# Patient Record
Sex: Female | Born: 1960 | Race: Black or African American | Hispanic: No | Smoking: Former smoker
Health system: Southern US, Community
[De-identification: ages and names within clinical notes are randomized; demographics above are authoritative.]

## PROBLEM LIST (undated history)

## (undated) DIAGNOSIS — M1990 Unspecified osteoarthritis, unspecified site: Secondary | ICD-10-CM

---

## 2015-05-30 DIAGNOSIS — M62838 Other muscle spasm: Secondary | ICD-10-CM | POA: Diagnosis not present

## 2015-05-30 DIAGNOSIS — L508 Other urticaria: Secondary | ICD-10-CM | POA: Diagnosis not present

## 2015-05-30 DIAGNOSIS — R21 Rash and other nonspecific skin eruption: Secondary | ICD-10-CM | POA: Diagnosis not present

## 2015-06-19 DIAGNOSIS — Z1231 Encounter for screening mammogram for malignant neoplasm of breast: Secondary | ICD-10-CM | POA: Diagnosis not present

## 2015-07-30 DIAGNOSIS — M545 Low back pain: Secondary | ICD-10-CM | POA: Diagnosis not present

## 2015-09-12 DIAGNOSIS — G8929 Other chronic pain: Secondary | ICD-10-CM | POA: Diagnosis not present

## 2015-09-12 DIAGNOSIS — Z79899 Other long term (current) drug therapy: Secondary | ICD-10-CM | POA: Diagnosis not present

## 2015-09-12 DIAGNOSIS — T50905A Adverse effect of unspecified drugs, medicaments and biological substances, initial encounter: Secondary | ICD-10-CM | POA: Diagnosis not present

## 2016-03-19 DIAGNOSIS — M545 Low back pain: Secondary | ICD-10-CM | POA: Diagnosis not present

## 2016-03-19 DIAGNOSIS — M542 Cervicalgia: Secondary | ICD-10-CM | POA: Diagnosis not present

## 2016-06-17 DIAGNOSIS — Z1231 Encounter for screening mammogram for malignant neoplasm of breast: Secondary | ICD-10-CM | POA: Diagnosis not present

## 2016-06-23 DIAGNOSIS — R51 Headache: Secondary | ICD-10-CM | POA: Diagnosis not present

## 2016-06-23 DIAGNOSIS — M62838 Other muscle spasm: Secondary | ICD-10-CM | POA: Diagnosis not present

## 2016-06-23 DIAGNOSIS — Z79899 Other long term (current) drug therapy: Secondary | ICD-10-CM | POA: Diagnosis not present

## 2016-06-23 DIAGNOSIS — I10 Essential (primary) hypertension: Secondary | ICD-10-CM | POA: Diagnosis not present

## 2016-12-23 DIAGNOSIS — G8929 Other chronic pain: Secondary | ICD-10-CM | POA: Diagnosis not present

## 2016-12-23 DIAGNOSIS — I10 Essential (primary) hypertension: Secondary | ICD-10-CM | POA: Diagnosis not present

## 2016-12-23 DIAGNOSIS — Z79899 Other long term (current) drug therapy: Secondary | ICD-10-CM | POA: Diagnosis not present

## 2016-12-23 DIAGNOSIS — M545 Low back pain: Secondary | ICD-10-CM | POA: Diagnosis not present

## 2017-04-06 DIAGNOSIS — J328 Other chronic sinusitis: Secondary | ICD-10-CM | POA: Diagnosis not present

## 2017-04-06 DIAGNOSIS — F17200 Nicotine dependence, unspecified, uncomplicated: Secondary | ICD-10-CM | POA: Diagnosis not present

## 2017-04-06 DIAGNOSIS — M545 Low back pain: Secondary | ICD-10-CM | POA: Diagnosis not present

## 2017-04-06 DIAGNOSIS — I10 Essential (primary) hypertension: Secondary | ICD-10-CM | POA: Diagnosis not present

## 2017-04-06 DIAGNOSIS — Z79899 Other long term (current) drug therapy: Secondary | ICD-10-CM | POA: Diagnosis not present

## 2017-04-06 DIAGNOSIS — G8929 Other chronic pain: Secondary | ICD-10-CM | POA: Diagnosis not present

## 2017-07-17 DIAGNOSIS — Z1231 Encounter for screening mammogram for malignant neoplasm of breast: Secondary | ICD-10-CM | POA: Diagnosis not present

## 2017-09-11 DIAGNOSIS — M797 Fibromyalgia: Secondary | ICD-10-CM | POA: Diagnosis not present

## 2017-09-11 DIAGNOSIS — M150 Primary generalized (osteo)arthritis: Secondary | ICD-10-CM | POA: Diagnosis not present

## 2017-10-30 DIAGNOSIS — E785 Hyperlipidemia, unspecified: Secondary | ICD-10-CM | POA: Diagnosis not present

## 2017-10-30 DIAGNOSIS — M130 Polyarthritis, unspecified: Secondary | ICD-10-CM | POA: Diagnosis not present

## 2017-11-10 DIAGNOSIS — M5412 Radiculopathy, cervical region: Secondary | ICD-10-CM | POA: Diagnosis not present

## 2017-11-17 DIAGNOSIS — M542 Cervicalgia: Secondary | ICD-10-CM | POA: Diagnosis not present

## 2017-11-19 DIAGNOSIS — M542 Cervicalgia: Secondary | ICD-10-CM | POA: Diagnosis not present

## 2017-12-03 DIAGNOSIS — E782 Mixed hyperlipidemia: Secondary | ICD-10-CM | POA: Diagnosis not present

## 2017-12-03 DIAGNOSIS — I83899 Varicose veins of unspecified lower extremities with other complications: Secondary | ICD-10-CM | POA: Diagnosis not present

## 2017-12-03 DIAGNOSIS — Z6823 Body mass index (BMI) 23.0-23.9, adult: Secondary | ICD-10-CM | POA: Diagnosis not present

## 2017-12-03 DIAGNOSIS — M130 Polyarthritis, unspecified: Secondary | ICD-10-CM | POA: Diagnosis not present

## 2017-12-07 ENCOUNTER — Other Ambulatory Visit: Payer: Self-pay | Admitting: Neurological Surgery

## 2017-12-07 DIAGNOSIS — M542 Cervicalgia: Secondary | ICD-10-CM | POA: Diagnosis not present

## 2017-12-07 DIAGNOSIS — Z6823 Body mass index (BMI) 23.0-23.9, adult: Secondary | ICD-10-CM | POA: Diagnosis not present

## 2017-12-30 NOTE — Pre-Procedure Instructions (Signed)
Alyssa Jimenez  12/30/2017      [id number] [place], [place] - [address] [facility] [address]  [place] [id number] Phone: [phone] Fax: [phone]    Your procedure is scheduled on   Friday 01/08/18  [facility] at 600 A.M.  Call this number if you have problems the morning of surgery:  [phone]   Remember:  Do not eat or drink after midnight.    Take these medicines the morning of surgery with A SIP OF WATER - DIAZEPAM (VALIUM), TRAMADOL (ULTRAM) IF NEEDED  7 days prior to surgery STOP taking any Aspirin(unless otherwise instructed by your surgeon), Aleve, Naproxen, Ibuprofen, Motrin, Advil, Goody's, BC's, all herbal medications, fish oil, and all vitamins     Do not wear jewelry, make-up or nail polish.  Do not wear lotions, powders, or perfumes, or deodorant.  Do not shave 48 hours prior to surgery.  Men may shave face and neck.  Do not bring valuables to the hospital.  [facility] is not responsible for any belongings or valuables.  Contacts, dentures or bridgework may not be worn into surgery.  Leave your suitcase in the car.  After surgery it may be brought to your room.  For patients admitted to the hospital, discharge time will be determined by your treatment team.  Patients discharged the day of surgery will not be allowed to drive home.   Name and phone number of your driver:    Special instructions:  [place] - Preparing for Surgery  Before surgery, you can play an important role.  Because skin is not sterile, your skin needs to be as free of germs as possible.  You can reduce the number of germs on you skin by washing with CHG (chlorahexidine gluconate) soap before surgery.  CHG is an antiseptic cleaner which kills germs and bonds with the skin to continue killing germs even after washing.  Oral Hygiene is also important in reducing the risk of  infection.  Remember to brush your teeth with your regular toothpaste the morning of surgery.  Please DO NOT use if you have an allergy to CHG or antibacterial soaps.  If your skin becomes reddened/irritated stop using the CHG and inform your nurse when you arrive at Short Stay.  Do not shave (including legs and underarms) for at least 48 hours prior to the first CHG shower.  You may shave your face.  Please follow these instructions carefully:   1.  Shower with CHG Soap the night before surgery and the morning of Surgery.  2.  If you choose to wash your hair, wash your hair first as usual with your normal shampoo.  3.  After you shampoo, rinse your hair and body thoroughly to remove the shampoo. 4.  Use CHG as you would any other liquid soap.  You can apply chg directly to the skin and wash gently with a      scrungie or washcloth.           5.  Apply the CHG Soap to your body ONLY FROM THE NECK DOWN.   Do not use on open wounds or open sores. Avoid contact with your eyes, ears, mouth and genitals (private parts).  Wash genitals (private parts) with your normal soap.  6.  Wash thoroughly, paying special attention to the area where your surgery will be performed.  7.  Thoroughly  rinse your body with warm water from the neck down.  8.  DO NOT shower/wash with your normal soap after using and rinsing off the CHG Soap.  9.  Pat yourself dry with a clean towel.            10.  Wear clean pajamas.            11.  Place clean sheets on your bed the night of your first shower and do not sleep with pets.  Day of Surgery  Do not apply any lotions/deoderants the morning of surgery.   Please wear clean clothes to the hospital/surgery center. Remember to brush your teeth with toothpaste.     Please read over the following fact sheets that you were given. Pain Booklet, MRSA Information and Surgical Site Infection Prevention

## 2017-12-31 ENCOUNTER — Other Ambulatory Visit: Payer: Self-pay

## 2017-12-31 ENCOUNTER — Encounter (HOSPITAL_COMMUNITY)
Admission: RE | Admit: 2017-12-31 | Discharge: 2017-12-31 | Disposition: A | Discharge status: Home / Self Care | Payer: Medicare Other | Source: Ambulatory Visit | Attending: Neurological Surgery | Admitting: Neurological Surgery

## 2017-12-31 ENCOUNTER — Encounter (HOSPITAL_COMMUNITY): Payer: Self-pay | Admitting: *Deleted

## 2017-12-31 ENCOUNTER — Ambulatory Visit (HOSPITAL_COMMUNITY)
Admission: RE | Admit: 2017-12-31 | Discharge: 2017-12-31 | Disposition: A | Discharge status: Home / Self Care | Payer: Medicare Other | Source: Ambulatory Visit | Attending: Neurological Surgery | Admitting: Neurological Surgery

## 2017-12-31 DIAGNOSIS — M542 Cervicalgia: Secondary | ICD-10-CM | POA: Diagnosis not present

## 2017-12-31 DIAGNOSIS — R0789 Other chest pain: Secondary | ICD-10-CM | POA: Diagnosis not present

## 2017-12-31 DIAGNOSIS — Z01818 Encounter for other preprocedural examination: Secondary | ICD-10-CM | POA: Insufficient documentation

## 2017-12-31 HISTORY — DX: Unspecified osteoarthritis, unspecified site: M19.90

## 2017-12-31 LAB — BASIC METABOLIC PANEL
Anion gap: 8 (ref 5–15)
BUN: 12 mg/dL (ref 6–20)
CO2: 23 mmol/L (ref 22–32)
Calcium: 10 mg/dL (ref 8.9–10.3)
Chloride: 110 mmol/L (ref 98–111)
Creatinine, Ser: 0.9 mg/dL (ref 0.44–1.00)
GFR calc Af Amer: >60 mL/min (ref >60)
GFR calc non Af Amer: >60 mL/min (ref >60)
Glucose, Bld: 73 mg/dL (ref 70–99)
Potassium: 4.9 mmol/L (ref 3.5–5.1)
Sodium: 141 mmol/L (ref 135–145)

## 2017-12-31 LAB — SURGICAL PCR SCREEN
MRSA, PCR: NEGATIVE
STAPHYLOCOCCUS AUREUS: NEGATIVE

## 2017-12-31 NOTE — Pre-Procedure Instructions (Signed)
Alyssa Jimenez  12/31/2017      Your procedure is scheduled on   Friday 01/08/18  Report to [facility] Admitting at 6:00 A.M.  Call this number if you have problems the morning of surgery:  [phone]   Remember:  Do not eat or drink after midnight.    Take these medicines the morning of surgery with A SIP OF WATER - DIAZEPAM (VALIUM), TRAMADOL (ULTRAM) IF NEEDED  7 days prior to surgery STOP taking any Aspirin(unless otherwise instructed by your surgeon), Aleve, Naproxen, Ibuprofen, Motrin, Advil, Goody's, BC's, all herbal medications, fish oil, and all vitamins     Do not wear jewelry, make-up or nail polish.  Do not wear lotions, powders, or perfumes, or deodorant.  Do not shave 48 hours prior to surgery.  Men may shave face and neck.  Do not bring valuables to the hospital.  [facility] is not responsible for any belongings or valuables.  Contacts, dentures or bridgework may not be worn into surgery.  Leave your suitcase in the car.  After surgery it may be brought to your room.  For patients admitted to the hospital, discharge time will be determined by your treatment team.  Patients discharged the day of surgery will not be allowed to drive home.   Name and phone number of your driver:    Special instructions:  [place] - Preparing for Surgery  Before surgery, you can play an important role.  Because skin is not sterile, your skin needs to be as free of germs as possible.  You can reduce the number of germs on you skin by washing with CHG (chlorahexidine gluconate) soap before surgery.  CHG is an antiseptic cleaner which kills germs and bonds with the skin to continue killing germs even after washing.  Oral Hygiene is also important in reducing the risk of infection.  Remember to brush your teeth with your regular toothpaste the morning of surgery.  Please DO NOT use if you have an allergy to CHG or antibacterial soaps.  If your skin becomes  reddened/irritated stop using the CHG and inform your nurse when you arrive at Short Stay.  Do not shave (including legs and underarms) for at least 48 hours prior to the first CHG shower.  You may shave your face.  Please follow these instructions carefully:   1.  Shower with CHG Soap the night before surgery and the morning of Surgery.  2.  If you choose to wash your hair, wash your hair first as usual with your normal shampoo.  3.  After you shampoo, rinse your hair and body thoroughly to remove the shampoo.  Wash your face and private area with the soap you use at home, then rinse.  4.  Use CHG as you would any other liquid soap.  You can apply chg directly to the skin and wash gently with a scrungie or washcloth.                        5.  Apply the CHG Soap to your body ONLY FROM THE NECK DOWN.   Do not use on open wounds or open sores. Avoid contact with your eyes, ears, mouth and genitals (private parts).     6.  Wash thoroughly, paying special attention to the area where your surgery will be performed.   7.  Thoroughly rinse your body with warm water from the neck down.   8.  DO NOT shower/wash with your normal soap after using and rinsing off the CHG Soap.  9.  Pat yourself dry with a clean towel.             10.  Wear clean pajamas.             11.  Place clean sheets on your bed the night of your first shower and do not sleep with pets.  Day of Surgery  Do not apply any lotions/deoderants the morning of surgery.   Please wear clean clothes to the hospital/surgery center. Remember to brush your teeth with toothpaste.  Do not wear jewelry, make-up or nail polish.  Do not wear lotions, powders, or perfumes, or deodorant.  Do not shave 48 hours prior to surgery.  Men may shave face and neck.  Do not bring valuables to the hospital.  [facility] is not responsible for any belongings or valuables.  Contacts, dentures or bridgework may not be worn into surgery.  Leave your  suitcase in the car.  After surgery it may be brought to your room.  For patients admitted to the hospital, discharge time will be determined by your treatment team.  Please read over the following fact sheets that you were given. Pain Booklet, MRSA Information and Surgical Site Infection Prevention

## 2017-12-31 NOTE — Progress Notes (Addendum)
I asked Ms Alyssa Jimenez if she is experiencing pain, patient places her hand over the left side of her chest that travels to her left arm. Patient reports it as aching pain that has been coming and going since she was injured in 2011. I asked patient if she was checked out to see if it was cardiac related and she said yes at [facility] [place],  snt there by employer.[employer].  "They said it is from my neck." I sent a fax requesting orders. Ms Alyssa Jimenez denies shortness of breath, she is disabled due to pain, but she of ten cares for 3 grandchildren ages 89 x2 and 89.  I shared the above information with Antionette PolesJames Burns, PA. PCP is Dr Lowella BandyV Bland.

## 2017-12-31 NOTE — Progress Notes (Signed)
CBC with Diff and PT/INR clotted- I notified Erie NoeVanessa, she calaled back and said it is ok to draw the DOS.

## 2018-01-01 ENCOUNTER — Encounter (HOSPITAL_COMMUNITY): Payer: Self-pay

## 2018-01-01 NOTE — Progress Notes (Signed)
Anesthesia Chart Review:  Case:  [id number] Date/Time:  01/08/18 0800   Procedure:  ACDF - C4-C5 - C5-C6 - C6-C7 (N/A )   Anesthesia type:  General   Pre-op diagnosis:  Cervicalgia   Location:  [facility] OR ROOM 18 / [facility] OR   Surgeon:  Tia AlertJones, David S, MD      DISCUSSION: 57 yo female former smoker for above procedure.   Pt reports she has had pain radiating from her left neck through left chest/shoulder and down left arm since injury in 2011. Says she had cardiac eval at [facility] in [place] at the time of her injury that was negative and she was told pain was from her neck. EKG performed 01/01/18 with NSR. No history of CAD. [facility].  Pt's pain in left neck/shoulder/chest/arm likely radicular related to her neck injury. Anticipate she can proceed with surgery as planned barring acute status change.   VS: BP 133/78   Pulse 73   Temp 36.8 C   Resp 20   Ht 5\' 2"  (1.575 m)   Wt 59.5 kg   SpO2 98%   BMI 23.98 kg/m   PROVIDERS: Renaye RakersBland, Veita, MD is PCP   LABS: Labs reviewed: Acceptable for surgery.  CBC with Diff and PT/INR clotted- Dr. Yetta BarreJones scheduler Erie NoeVanessa notified, she said it is ok to draw DOS, orders placed by PAT nurse.  Labs Reviewed - No data to display all labs WNL   IMAGES: CHEST - 2 VIEW 12/31/2017  COMPARISON:  None.  FINDINGS: The heart size and mediastinal contours are within normal limits. Both lungs are clear. The visualized skeletal structures are unremarkable.  IMPRESSION: No active cardiopulmonary disease.   EKG: 12/31/2017: NSR  CV: N/A  Past Medical History:  Diagnosis Date  . Arthritis    Spine    Past Surgical History:  Procedure Laterality Date  . CESAREAN SECTION      MEDICATIONS: . cetirizine (ZYRTEC) 10 MG tablet  . cyclobenzaprine (FLEXERIL) 10 MG tablet  . diazepam (VALIUM) 5 MG tablet  . megestrol (MEGACE) 40 MG tablet  . Multiple Vitamins-Minerals (ONE DAILY MULTIVITAMIN WOMEN) TABS  .  naproxen (NAPROSYN) 500 MG tablet  . traMADol (ULTRAM) 50 MG tablet   No current facility-administered medications for this encounter.     Zannie CoveJames Burns, PA-C [facility]/Anesthesiology Phone [phone] 01/01/2018 12:06 PM

## 2018-01-07 ENCOUNTER — Encounter (HOSPITAL_COMMUNITY): Payer: Self-pay | Admitting: Certified Registered Nurse Anesthetist

## 2018-01-08 ENCOUNTER — Inpatient Hospital Stay (HOSPITAL_COMMUNITY): Payer: Medicare HMO

## 2018-01-08 ENCOUNTER — Encounter (HOSPITAL_COMMUNITY): Payer: Self-pay | Admitting: *Deleted

## 2018-01-08 ENCOUNTER — Ambulatory Visit (HOSPITAL_COMMUNITY)
Admission: RE | Disposition: A | Discharge status: Home / Self Care | Payer: Self-pay | Source: Ambulatory Visit | Attending: Neurological Surgery

## 2018-01-08 ENCOUNTER — Inpatient Hospital Stay (HOSPITAL_COMMUNITY): Payer: Medicare HMO | Admitting: Physician Assistant

## 2018-01-08 ENCOUNTER — Other Ambulatory Visit: Payer: Self-pay

## 2018-01-08 ENCOUNTER — Inpatient Hospital Stay (HOSPITAL_COMMUNITY): Payer: Medicare HMO | Admitting: Certified Registered Nurse Anesthetist

## 2018-01-08 ENCOUNTER — Observation Stay (HOSPITAL_COMMUNITY)
Admission: RE | Admit: 2018-01-08 | Discharge: 2018-01-09 | Disposition: A | Discharge status: Home / Self Care | Payer: Medicare HMO | Source: Ambulatory Visit | Attending: Neurological Surgery | Admitting: Neurological Surgery

## 2018-01-08 DIAGNOSIS — M4322 Fusion of spine, cervical region: Secondary | ICD-10-CM | POA: Diagnosis present

## 2018-01-08 DIAGNOSIS — Z87891 Personal history of nicotine dependence: Secondary | ICD-10-CM | POA: Insufficient documentation

## 2018-01-08 DIAGNOSIS — M4802 Spinal stenosis, cervical region: Secondary | ICD-10-CM | POA: Insufficient documentation

## 2018-01-08 DIAGNOSIS — Z981 Arthrodesis status: Secondary | ICD-10-CM | POA: Diagnosis not present

## 2018-01-08 DIAGNOSIS — Z880 Allergy status to penicillin: Secondary | ICD-10-CM | POA: Insufficient documentation

## 2018-01-08 DIAGNOSIS — M47812 Spondylosis without myelopathy or radiculopathy, cervical region: Principal | ICD-10-CM | POA: Insufficient documentation

## 2018-01-08 DIAGNOSIS — M542 Cervicalgia: Secondary | ICD-10-CM

## 2018-01-08 DIAGNOSIS — M50321 Other cervical disc degeneration at C4-C5 level: Secondary | ICD-10-CM | POA: Diagnosis not present

## 2018-01-08 DIAGNOSIS — S13150A Subluxation of C4/C5 cervical vertebrae, initial encounter: Secondary | ICD-10-CM | POA: Diagnosis not present

## 2018-01-08 DIAGNOSIS — Z419 Encounter for procedure for purposes other than remedying health state, unspecified: Secondary | ICD-10-CM

## 2018-01-08 HISTORY — PX: ANTERIOR CERVICAL DECOMP/DISCECTOMY FUSION: SHX1161

## 2018-01-08 LAB — CBC WITH DIFFERENTIAL/PLATELET
Abs Immature Granulocytes: 0.1 10*3/uL (ref 0.0–0.1)
BASOS ABS: 0.1 10*3/uL (ref 0.0–0.1)
BASOS PCT: 1 %
Eosinophils Absolute: 0.2 10*3/uL (ref 0.0–0.7)
Eosinophils Relative: 2 %
HCT: 46.8 % — ABNORMAL HIGH (ref 36.0–46.0)
Hemoglobin: 14.7 g/dL (ref 12.0–15.0)
IMMATURE GRANULOCYTES: 1 %
Lymphocytes Relative: 33 %
Lymphs Abs: 4 10*3/uL (ref 0.7–4.0)
MCH: 28.4 pg (ref 26.0–34.0)
MCHC: 31.4 g/dL (ref 30.0–36.0)
MCV: 90.3 fL (ref 78.0–100.0)
MONOS PCT: 7 %
Monocytes Absolute: 0.9 10*3/uL (ref 0.1–1.0)
NEUTROS ABS: 7.1 10*3/uL (ref 1.7–7.7)
NEUTROS PCT: 56 %
Platelets: 376 10*3/uL (ref 150–400)
RBC: 5.18 MIL/uL — ABNORMAL HIGH (ref 3.87–5.11)
RDW: 14 % (ref 11.5–15.5)
WBC: 12.4 10*3/uL — ABNORMAL HIGH (ref 4.0–10.5)

## 2018-01-08 SURGERY — ANTERIOR CERVICAL DECOMPRESSION/DISCECTOMY FUSION 3 LEVELS
Anesthesia: General

## 2018-01-08 MED ORDER — SUGAMMADEX SODIUM 500 MG/5ML IV SOLN
INTRAVENOUS | Status: AC
  Filled 2018-01-08: qty 5

## 2018-01-08 MED ORDER — LIDOCAINE 2% (20 MG/ML) 5 ML SYRINGE
INTRAMUSCULAR | Status: AC
  Filled 2018-01-08: qty 5

## 2018-01-08 MED ORDER — SODIUM CHLORIDE 0.9% FLUSH
3.0000 mL | Freq: Two times a day (BID) | INTRAVENOUS | Status: DC
  Administered 2018-01-08 (×2): 3 mL via INTRAVENOUS

## 2018-01-08 MED ORDER — ALUM & MAG HYDROXIDE-SIMETH 200-200-20 MG/5ML PO SUSP
30.0000 mL | Freq: Four times a day (QID) | ORAL | Status: DC | PRN
  Administered 2018-01-08: 30 mL via ORAL
  Filled 2018-01-08: qty 30

## 2018-01-08 MED ORDER — DEXAMETHASONE SODIUM PHOSPHATE 10 MG/ML IJ SOLN
INTRAMUSCULAR | Status: AC
  Filled 2018-01-08: qty 1

## 2018-01-08 MED ORDER — ROCURONIUM BROMIDE 50 MG/5ML IV SOSY
PREFILLED_SYRINGE | INTRAVENOUS | Status: AC
  Filled 2018-01-08: qty 5

## 2018-01-08 MED ORDER — ONDANSETRON HCL 4 MG/2ML IJ SOLN: 4.0000 mg | Freq: Four times a day (QID) | INTRAMUSCULAR | Status: DC | PRN

## 2018-01-08 MED ORDER — ROCURONIUM BROMIDE 50 MG/5ML IV SOSY
PREFILLED_SYRINGE | INTRAVENOUS | Status: DC | PRN
  Administered 2018-01-08: 50 mg via INTRAVENOUS
  Administered 2018-01-08: 20 mg via INTRAVENOUS

## 2018-01-08 MED ORDER — MEPERIDINE HCL 50 MG/ML IJ SOLN: 6.2500 mg | INTRAMUSCULAR | Status: DC | PRN

## 2018-01-08 MED ORDER — THROMBIN 5000 UNITS EX SOLR
CUTANEOUS | Status: AC
  Filled 2018-01-08: qty 5000

## 2018-01-08 MED ORDER — METHOCARBAMOL 500 MG PO TABS
500.0000 mg | ORAL_TABLET | Freq: Four times a day (QID) | ORAL | Status: DC | PRN
  Administered 2018-01-08 – 2018-01-09 (×4): 500 mg via ORAL
  Filled 2018-01-08 (×3): qty 1

## 2018-01-08 MED ORDER — LACTATED RINGERS IV SOLN: INTRAVENOUS | Status: DC

## 2018-01-08 MED ORDER — SODIUM CHLORIDE 0.9 % IV SOLN: 250.0000 mL | INTRAVENOUS | Status: DC

## 2018-01-08 MED ORDER — VANCOMYCIN HCL IN DEXTROSE 1-5 GM/200ML-% IV SOLN
INTRAVENOUS | Status: AC
  Filled 2018-01-08: qty 200

## 2018-01-08 MED ORDER — MEGESTROL ACETATE 40 MG PO TABS
40.0000 mg | ORAL_TABLET | Freq: Every day | ORAL | Status: DC
  Administered 2018-01-08: 40 mg via ORAL
  Filled 2018-01-08 (×3): qty 1

## 2018-01-08 MED ORDER — THROMBIN 5000 UNITS EX SOLR
OROMUCOSAL | Status: DC | PRN
  Administered 2018-01-08: 08:00:00 via TOPICAL

## 2018-01-08 MED ORDER — METHOCARBAMOL 500 MG PO TABS
ORAL_TABLET | ORAL | Status: AC
  Filled 2018-01-08: qty 1

## 2018-01-08 MED ORDER — BUPIVACAINE HCL (PF) 0.25 % IJ SOLN
INTRAMUSCULAR | Status: DC | PRN
  Administered 2018-01-08: 4 mL

## 2018-01-08 MED ORDER — SODIUM CHLORIDE 0.9 % IV SOLN
INTRAVENOUS | Status: DC | PRN
  Administered 2018-01-08: 08:00:00

## 2018-01-08 MED ORDER — FENTANYL CITRATE (PF) 100 MCG/2ML IJ SOLN
INTRAMUSCULAR | Status: AC
  Filled 2018-01-08: qty 2

## 2018-01-08 MED ORDER — PROPOFOL 10 MG/ML IV BOLUS
INTRAVENOUS | Status: AC
  Filled 2018-01-08: qty 20

## 2018-01-08 MED ORDER — SODIUM CHLORIDE 0.9 % IV SOLN
INTRAVENOUS | Status: DC | PRN
  Administered 2018-01-08: 20 ug/min via INTRAVENOUS

## 2018-01-08 MED ORDER — LACTATED RINGERS IV SOLN
INTRAVENOUS | Status: DC | PRN
  Administered 2018-01-08: 07:00:00 via INTRAVENOUS

## 2018-01-08 MED ORDER — MENTHOL 3 MG MT LOZG
1.0000 | LOZENGE | OROMUCOSAL | Status: DC | PRN
  Filled 2018-01-08: qty 9

## 2018-01-08 MED ORDER — VANCOMYCIN HCL 1000 MG IV SOLR
INTRAVENOUS | Status: DC | PRN
  Administered 2018-01-08: 1000 mg via INTRAVENOUS

## 2018-01-08 MED ORDER — HYDROCODONE-ACETAMINOPHEN 7.5-325 MG PO TABS
1.0000 | ORAL_TABLET | ORAL | Status: DC | PRN
  Administered 2018-01-08: 1 via ORAL

## 2018-01-08 MED ORDER — FENTANYL CITRATE (PF) 250 MCG/5ML IJ SOLN
INTRAMUSCULAR | Status: AC
  Filled 2018-01-08: qty 5

## 2018-01-08 MED ORDER — DEXAMETHASONE SODIUM PHOSPHATE 10 MG/ML IJ SOLN
10.0000 mg | INTRAMUSCULAR | Status: AC
  Administered 2018-01-08: 10 mg via INTRAVENOUS

## 2018-01-08 MED ORDER — PHENYLEPHRINE 40 MCG/ML (10ML) SYRINGE FOR IV PUSH (FOR BLOOD PRESSURE SUPPORT)
PREFILLED_SYRINGE | INTRAVENOUS | Status: AC
  Filled 2018-01-08: qty 10

## 2018-01-08 MED ORDER — THROMBIN 20000 UNITS EX KIT
PACK | CUTANEOUS | Status: AC
  Filled 2018-01-08: qty 1

## 2018-01-08 MED ORDER — PHENYLEPHRINE 40 MCG/ML (10ML) SYRINGE FOR IV PUSH (FOR BLOOD PRESSURE SUPPORT)
PREFILLED_SYRINGE | INTRAVENOUS | Status: DC | PRN
  Administered 2018-01-08: 80 ug via INTRAVENOUS

## 2018-01-08 MED ORDER — FENTANYL CITRATE (PF) 100 MCG/2ML IJ SOLN
25.0000 ug | INTRAMUSCULAR | Status: DC | PRN
  Administered 2018-01-08 (×3): 50 ug via INTRAVENOUS

## 2018-01-08 MED ORDER — ONDANSETRON HCL 4 MG PO TABS: 4.0000 mg | ORAL_TABLET | Freq: Four times a day (QID) | ORAL | Status: DC | PRN

## 2018-01-08 MED ORDER — BUPIVACAINE HCL (PF) 0.25 % IJ SOLN
INTRAMUSCULAR | Status: AC
  Filled 2018-01-08: qty 30

## 2018-01-08 MED ORDER — SENNA 8.6 MG PO TABS
1.0000 | ORAL_TABLET | Freq: Two times a day (BID) | ORAL | Status: DC
  Administered 2018-01-08: 8.6 mg via ORAL
  Filled 2018-01-08: qty 1

## 2018-01-08 MED ORDER — METOCLOPRAMIDE HCL 5 MG/ML IJ SOLN: 10.0000 mg | Freq: Once | INTRAMUSCULAR | Status: DC | PRN

## 2018-01-08 MED ORDER — ACETAMINOPHEN 325 MG PO TABS
650.0000 mg | ORAL_TABLET | ORAL | Status: DC | PRN
  Administered 2018-01-08: 650 mg via ORAL
  Filled 2018-01-08: qty 2

## 2018-01-08 MED ORDER — LIDOCAINE 2% (20 MG/ML) 5 ML SYRINGE
INTRAMUSCULAR | Status: DC | PRN
  Administered 2018-01-08: 100 mg via INTRAVENOUS

## 2018-01-08 MED ORDER — 0.9 % SODIUM CHLORIDE (POUR BTL) OPTIME
TOPICAL | Status: DC | PRN
  Administered 2018-01-08: 1000 mL

## 2018-01-08 MED ORDER — HYDROCODONE-ACETAMINOPHEN 7.5-325 MG PO TABS
ORAL_TABLET | ORAL | Status: AC
  Filled 2018-01-08: qty 1

## 2018-01-08 MED ORDER — SUGAMMADEX SODIUM 200 MG/2ML IV SOLN
INTRAVENOUS | Status: DC | PRN
  Administered 2018-01-08: 125 mg via INTRAVENOUS

## 2018-01-08 MED ORDER — CHLORHEXIDINE GLUCONATE CLOTH 2 % EX PADS: 6.0000 | MEDICATED_PAD | Freq: Once | CUTANEOUS | Status: DC

## 2018-01-08 MED ORDER — OXYCODONE HCL 5 MG PO TABS
5.0000 mg | ORAL_TABLET | ORAL | Status: DC | PRN
  Administered 2018-01-08 – 2018-01-09 (×5): 5 mg via ORAL
  Filled 2018-01-08 (×5): qty 1

## 2018-01-08 MED ORDER — THROMBIN 20000 UNITS EX SOLR
CUTANEOUS | Status: DC | PRN
  Administered 2018-01-08: 08:00:00 via TOPICAL

## 2018-01-08 MED ORDER — METHOCARBAMOL 1000 MG/10ML IJ SOLN
500.0000 mg | Freq: Four times a day (QID) | INTRAVENOUS | Status: DC | PRN
  Filled 2018-01-08: qty 5

## 2018-01-08 MED ORDER — PROPOFOL 10 MG/ML IV BOLUS
INTRAVENOUS | Status: DC | PRN
  Administered 2018-01-08: 150 mg via INTRAVENOUS

## 2018-01-08 MED ORDER — POTASSIUM CHLORIDE IN NACL 20-0.9 MEQ/L-% IV SOLN: INTRAVENOUS | Status: DC

## 2018-01-08 MED ORDER — FENTANYL CITRATE (PF) 100 MCG/2ML IJ SOLN
INTRAMUSCULAR | Status: DC | PRN
  Administered 2018-01-08 (×6): 50 ug via INTRAVENOUS

## 2018-01-08 MED ORDER — ONDANSETRON HCL 4 MG/2ML IJ SOLN
INTRAMUSCULAR | Status: AC
  Filled 2018-01-08: qty 2

## 2018-01-08 MED ORDER — SODIUM CHLORIDE 0.9% FLUSH: 3.0000 mL | INTRAVENOUS | Status: DC | PRN

## 2018-01-08 MED ORDER — HYDROMORPHONE HCL 1 MG/ML IJ SOLN
0.5000 mg | INTRAMUSCULAR | Status: DC | PRN
  Administered 2018-01-08 – 2018-01-09 (×2): 0.5 mg via INTRAVENOUS
  Filled 2018-01-08 (×2): qty 0.5

## 2018-01-08 MED ORDER — ACETAMINOPHEN 650 MG RE SUPP: 650.0000 mg | RECTAL | Status: DC | PRN

## 2018-01-08 MED ORDER — CEFAZOLIN SODIUM-DEXTROSE 2-4 GM/100ML-% IV SOLN
2.0000 g | INTRAVENOUS | Status: DC
  Filled 2018-01-08: qty 100

## 2018-01-08 MED ORDER — PHENOL 1.4 % MT LIQD: 1.0000 | OROMUCOSAL | Status: DC | PRN

## 2018-01-08 MED ORDER — MIDAZOLAM HCL 2 MG/2ML IJ SOLN
INTRAMUSCULAR | Status: AC
  Filled 2018-01-08: qty 2

## 2018-01-08 MED ORDER — ONDANSETRON HCL 4 MG/2ML IJ SOLN
INTRAMUSCULAR | Status: DC | PRN
  Administered 2018-01-08: 4 mg via INTRAVENOUS

## 2018-01-08 MED ORDER — MIDAZOLAM HCL 5 MG/5ML IJ SOLN
INTRAMUSCULAR | Status: DC | PRN
  Administered 2018-01-08: 2 mg via INTRAVENOUS

## 2018-01-08 MED ORDER — DEXAMETHASONE SODIUM PHOSPHATE 4 MG/ML IJ SOLN: 4.0000 mg | Freq: Four times a day (QID) | INTRAMUSCULAR | Status: DC

## 2018-01-08 MED ORDER — VANCOMYCIN HCL IN DEXTROSE 1-5 GM/200ML-% IV SOLN
1000.0000 mg | Freq: Once | INTRAVENOUS | Status: AC
  Administered 2018-01-08: 1000 mg via INTRAVENOUS
  Filled 2018-01-08: qty 200

## 2018-01-08 MED ORDER — DEXAMETHASONE 4 MG PO TABS
4.0000 mg | ORAL_TABLET | Freq: Four times a day (QID) | ORAL | Status: DC
  Administered 2018-01-08 – 2018-01-09 (×4): 4 mg via ORAL
  Filled 2018-01-08 (×3): qty 1

## 2018-01-08 SURGICAL SUPPLY — 53 items
BAG DECANTER FOR FLEXI CONT (MISCELLANEOUS) ×2 IMPLANT
BASKET BONE COLLECTION (BASKET) IMPLANT
BENZOIN TINCTURE PRP APPL 2/3 (GAUZE/BANDAGES/DRESSINGS) ×2 IMPLANT
BIT DRILL 2.3 12 FIXED (INSTRUMENTS) ×1 IMPLANT
BUR MATCHSTICK NEURO 3.0 LAGG (BURR) ×2 IMPLANT
CANISTER SUCT 3000ML PPV (MISCELLANEOUS) ×2 IMPLANT
CARTRIDGE OIL MAESTRO DRILL (MISCELLANEOUS) ×1 IMPLANT
CLSR STERI-STRIP ANTIMIC 1/2X4 (GAUZE/BANDAGES/DRESSINGS) ×2 IMPLANT
DERMABOND ADVANCED (GAUZE/BANDAGES/DRESSINGS) ×1
DERMABOND ADVANCED .7 DNX12 (GAUZE/BANDAGES/DRESSINGS) ×1 IMPLANT
DIFFUSER DRILL AIR PNEUMATIC (MISCELLANEOUS) ×2 IMPLANT
DRAPE C-ARM 42X72 X-RAY (DRAPES) ×4 IMPLANT
DRAPE LAPAROTOMY 100X72 PEDS (DRAPES) ×2 IMPLANT
DRAPE MICROSCOPE LEICA (MISCELLANEOUS) ×2 IMPLANT
DRILL 12MM (INSTRUMENTS) ×2
DRSG OPSITE POSTOP 4X6 (GAUZE/BANDAGES/DRESSINGS) ×2 IMPLANT
DURAPREP 6ML APPLICATOR 50/CS (WOUND CARE) ×2 IMPLANT
ELECT COATED BLADE 2.86 ST (ELECTRODE) ×2 IMPLANT
ELECT REM PT RETURN 9FT ADLT (ELECTROSURGICAL) ×2
ELECTRODE REM PT RTRN 9FT ADLT (ELECTROSURGICAL) ×1 IMPLANT
GAUZE 4X4 16PLY RFD (DISPOSABLE) IMPLANT
GLOVE BIO SURGEON STRL SZ7 (GLOVE) IMPLANT
GLOVE BIO SURGEON STRL SZ8 (GLOVE) ×4 IMPLANT
GLOVE BIOGEL PI IND STRL 7.0 (GLOVE) IMPLANT
GLOVE BIOGEL PI INDICATOR 7.0 (GLOVE)
GOWN STRL REUS W/ TWL LRG LVL3 (GOWN DISPOSABLE) IMPLANT
GOWN STRL REUS W/ TWL XL LVL3 (GOWN DISPOSABLE) ×1 IMPLANT
GOWN STRL REUS W/TWL 2XL LVL3 (GOWN DISPOSABLE) IMPLANT
GOWN STRL REUS W/TWL LRG LVL3 (GOWN DISPOSABLE)
GOWN STRL REUS W/TWL XL LVL3 (GOWN DISPOSABLE) ×1
HEMOSTAT POWDER KIT SURGIFOAM (HEMOSTASIS) ×2 IMPLANT
KIT BASIN OR (CUSTOM PROCEDURE TRAY) ×2 IMPLANT
KIT TURNOVER KIT B (KITS) ×2 IMPLANT
NEEDLE HYPO 25X1 1.5 SAFETY (NEEDLE) ×2 IMPLANT
NEEDLE SPNL 20GX3.5 QUINCKE YW (NEEDLE) ×2 IMPLANT
NS IRRIG 1000ML POUR BTL (IV SOLUTION) ×2 IMPLANT
OIL CARTRIDGE MAESTRO DRILL (MISCELLANEOUS) ×2
PACK LAMINECTOMY NEURO (CUSTOM PROCEDURE TRAY) ×2 IMPLANT
PAD ARMBOARD 7.5X6 YLW CONV (MISCELLANEOUS) ×2 IMPLANT
PIN DISTRACTION 14MM (PIN) ×4 IMPLANT
PLATE TRESTLE LUXE 39L L3 (Plate) ×2 IMPLANT
RUBBERBAND STERILE (MISCELLANEOUS) ×4 IMPLANT
SCREW 12 (Screw) ×16 IMPLANT
SPACER IDENTITI PS 5X16X14 7D (Spacer) ×6 IMPLANT
SPONGE INTESTINAL PEANUT (DISPOSABLE) ×2 IMPLANT
SPONGE SURGIFOAM ABS GEL 100 (HEMOSTASIS) ×2 IMPLANT
STRIP CLOSURE SKIN 1/2X4 (GAUZE/BANDAGES/DRESSINGS) ×2 IMPLANT
SUT MNCRL AB 4-0 PS2 18 (SUTURE) ×2 IMPLANT
SUT VIC AB 3-0 SH 8-18 (SUTURE) ×4 IMPLANT
SUT VICRYL 4-0 PS2 18IN ABS (SUTURE) IMPLANT
TOWEL GREEN STERILE (TOWEL DISPOSABLE) ×2 IMPLANT
TOWEL GREEN STERILE FF (TOWEL DISPOSABLE) ×2 IMPLANT
WATER STERILE IRR 1000ML POUR (IV SOLUTION) ×2 IMPLANT

## 2018-01-08 NOTE — Progress Notes (Signed)
Orthopedic Tech Progress Note Patient Details:  Alyssa Jimenez Mar 11, 1961 [id number]  Ortho Devices Type of Ortho Device: Soft collar Ortho Device/Splint Location: neck Ortho Device/Splint Interventions: Application   Post Interventions Patient Tolerated: Well Instructions Provided: Care of device   Nikki Dom 01/08/2018, 12:29 PM

## 2018-01-08 NOTE — Transfer of Care (Signed)
Immediate Anesthesia Transfer of Care Note  Patient: Alyssa Jimenez  Procedure(s) Performed: Anterior Cervical Decompression/ Discectomy Fusion - Cervical Four-Cervical Five - Cervical Five-Cervical Six - Cervical Six-Cervical Seven (N/A )  Patient Location: PACU  Anesthesia Type:General  Level of Consciousness: awake, alert  and oriented  Airway & Oxygen Therapy: Patient Spontanous Breathing and Patient connected to nasal cannula oxygen  Post-op Assessment: Report given to RN and Post -op Vital signs reviewed and stable  Post vital signs: Reviewed and stable  Last Vitals:  Vitals Value Taken Time  BP 139/82 01/08/2018  9:59 AM  Temp    Pulse 97 01/08/2018 10:00 AM  Resp 21 01/08/2018 10:00 AM  SpO2 96 % 01/08/2018 10:00 AM  Vitals shown include unvalidated device data.  Last Pain:  Vitals:   01/08/18 0714  TempSrc: Oral  PainSc: 7       Patients Stated Pain Goal: 4 (01/08/18 2336)  Complications: No apparent anesthesia complications

## 2018-01-08 NOTE — Progress Notes (Signed)
Pharmacy Antibiotic Note  Alyssa Jimenez is a 57 y.o. female admitted on 01/08/2018 planned spinal surgery.  Pharmacy has been consulted for Vancomycin dosing for surgical prophylaxis post-op.   Pre-op Vancomycin dose given at 0745. SCr 0.9 on pre-op labs, CrCl~50-60 ml/min. No drain in place per RN report.  Plan: - Vancomycin 1g IV x 1 dose at 2000 this evening - Pharmacy will sign off as no further doses are expected at this time  Height: 5\' 2"  (157.5 cm) Weight: 131 lb (59.4 kg) IBW/kg (Calculated) : 50.1  Temp (24hrs), Avg:97.5 F (36.4 C), Min:97.3 F (36.3 C), Max:97.7 F (36.5 C)  Recent Labs  Lab 01/08/18 0700  WBC 12.4*    Estimated Creatinine Clearance: 55.2 mL/min (by C-G formula based on SCr of 0.9 mg/dL).    Allergies  Allergen Reactions  . Penicillins Hives and Rash    Has patient had a PCN reaction causing immediate rash, facial/tongue/throat swelling, SOB or lightheadedness with hypotension: Yes Has patient had a PCN reaction causing severe rash involving mucus membranes or skin necrosis: No Has patient had a PCN reaction that required hospitalization: No Has patient had a PCN reaction occurring within the last 10 years: No If all of the above answers are "NO", then may proceed with Cephalosporin use.    Georgina Pillion, PharmD, BCPS Pager: [phone] 11:58 AM

## 2018-01-08 NOTE — Op Note (Signed)
01/08/2018  9:43 AM  PATIENT:  Alyssa Jimenez  57 y.o. 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 Decompressive anterior cervical discectomy C4-5 C5-6 C6-7, 2. Anterior cervical arthrodesis and C4-5 C5-6 C6-7 utilizing a porous titanium interbody cage packed with locally harvested morcellized autologous bone graft, 3. Anterior cervical plating C4-C7 inclusive utilizing a Alphatec plate  SURGEON:  Marikay Alar, MD  ASSISTANTS: Dr. Jordan Likes  ANESTHESIA:   General  EBL: 25 ml  Total I/O In: -  Out: 25 [Blood:25]  BLOOD ADMINISTERED: none  DRAINS: none  SPECIMEN:  none  INDICATION FOR PROCEDURE: This patient presented with neck and arm pain. Imaging showedsignificant cervical spondylosis C4-5 C5-C6 C6-7. The patient tried conservative measures without relief. Pain was debilitating. Recommended ACDF with plating. Patient understood the risks, benefits, and alternatives and potential outcomes and wished to proceed.  PROCEDURE DETAILS: Patient was brought to the operating room placed under general endotracheal anesthesia. Patient was placed in the supine position on the operating room table. The neck was prepped with Duraprep and draped in a sterile fashion.   Three cc of local anesthesia was injected and a transverse incision was made on the right side of the neck.  Dissection was carried down thru the subcutaneous tissue and the platysma was  elevated, opened, and undermined with Metzenbaum scissors.  Dissection was then carried out thru an avascular plane leaving the sternocleidomastoid carotid artery and jugular vein laterally and the trachea and esophagus medially. The ventral aspect of the vertebral column was identified and a localizing x-ray was taken. The and C4-5 level was identified. The longus colli muscles were then elevated and the  retractor was placed to expose C4-5 C5-6 and C6-7. The annulus was incised and the disc space entered at each level. The exact same decompression was performed at each level.  The disc spaces of C5-6 and C6-7 were extremely collapsed. Distraction pins were placed to distract the disc space in order to give working room. Discectomy was performed with micro-curettes and pituitary rongeurs. I then used the high-speed drill to drill the endplates down to the level of the posterior longitudinal ligament. The drill shavings were saved in a mucous trap for later arthrodesis. The operating microscope was draped and brought into the field provided additional magnification, illumination and visualization. Discectomy was continued posteriorly thru the disc space. Posterior longitudinal ligament was opened with a nerve hook, and then removed along with disc herniation and osteophytes, decompressing the spinal canal and thecal sac. We then continued to remove osteophytic overgrowth and disc material decompressing the neural foramina and exiting nerve roots bilaterally. The scope was angled up and down to help decompress and undercut the vertebral bodies. Once the decompression was completed we could pass a nerve hook circumferentially to assure adequate decompression in the midline and in the neural foramina. So by both visualization and palpation we felt we had an adequate decompression of the neural elements. We then measured the height of the intravertebral disc space and selected a 5 millimeter porous titanium interbody cage packed with autograft. It was then gently positioned in the intravertebral disc space(s) and countersunk. I then used a Alphatec plate and placed variable angle screws into the vertebral bodies of each level from C4-C7 and locked them into position. The wound was irrigated with bacitracin solution, checked for hemostasis which was established and confirmed. Once  meticulous hemostasis was achieved, we  then proceeded with closure. The platysma was closed with interrupted 3-0 undyed Vicryl suture, the subcuticular layer was closed with interrupted 3-0 undyed Vicryl suture. The skin edges were approximated with steristrips. The drapes were removed. A sterile dressing was applied. The patient was then awakened from general anesthesia and transferred to the recovery room in stable condition. At the end of the procedure all sponge, needle and instrument counts were correct.   PLAN OF CARE: Admit for overnight observation  PATIENT DISPOSITION:  PACU - hemodynamically stable.   Delay start of Pharmacological VTE agent (>24hrs) due to surgical blood loss or risk of bleeding:  yes

## 2018-01-08 NOTE — Evaluation (Signed)
Occupational Therapy Evaluation and Discharge Patient Details Name: Alyssa Jimenez MRN: [id number] DOB: 1961/02/15 Today's Date: 01/08/2018    History of Present Illness Decompressive anterior cervical discectomy C4-5 C5-6 C6-7 and fusion   Clinical Impression   This 57 yo female admitted and underwent above presents to acute OT with all education completed, we will D/C from acute OT.    Follow Up Recommendations  No OT follow up    Equipment Recommendations  None recommended by OT       Precautions / Restrictions Precautions Precautions: Cervical Precaution Booklet Issued: Yes (comment) Required Braces or Orthoses: Cervical Brace Cervical Brace: For comfort Restrictions Weight Bearing Restrictions: No      Mobility Bed Mobility Overal bed mobility: Modified Independent                Transfers Overall transfer level: Independent               General transfer comment: Pt went up/down 3 steps with step-to pattern independently    Balance Overall balance assessment: No apparent balance deficits (not formally assessed)                                         ADL either performed or assessed with clinical judgement   ADL                                         General ADL Comments: Educated pt on use of c-collar (can have off for showering, eating, mouth care if she prefers), use button up shirts or larger collar shirts, when putting on pull over shirts put neck in first, then one arm at a time slowly reaching away from body and not up, cross legs to do sock and shoes instead of bending over (pt can do this), use 2 cups for brushing teeth (one for spitting and one for rinsing with straw), use straws with all drink     Vision Patient Visual Report: No change from baseline              Pertinent Vitals/Pain Pain Assessment: 0-10 Pain Score: 6  Pain Location: incisional site Pain Descriptors / Indicators:  Aching;Sore Pain Intervention(s): Limited activity within patient's tolerance;Monitored during session;Premedicated before session     Hand Dominance Right   Extremity/Trunk Assessment Upper Extremity Assessment Upper Extremity Assessment: Overall WFL for tasks assessed           Communication Communication Communication: No difficulties   Cognition Arousal/Alertness: Awake/alert Behavior During Therapy: WFL for tasks assessed/performed Overall Cognitive Status: Within Functional Limits for tasks assessed                                                Home Living Family/patient expects to be discharged to:: Private residence Living Arrangements: Children(son) Available Help at Discharge: Family;Available 24 hours/day Type of Home: House Home Access: Stairs to enter [employer] of Steps: 3 Entrance Stairs-Rails: None Home Layout: One level     Bathroom Shower/Tub: Chief Strategy Officer: Standard     Home Equipment: None          Prior Functioning/Environment Level of  Independence: Independent                 OT Problem List: Pain         OT Goals(Current goals can be found in the care plan section) Acute Rehab OT Goals Patient Stated Goal: home tomorrow  OT Frequency:                AM-PAC PT "6 Clicks" Daily Activity     Outcome Measure Help from another person eating meals?: None Help from another person taking care of personal grooming?: None Help from another person toileting, which includes using toliet, bedpan, or urinal?: None Help from another person bathing (including washing, rinsing, drying)?: None Help from another person to put on and taking off regular upper body clothing?: None Help from another person to put on and taking off regular lower body clothing?: None 6 Click Score: 24   End of Session Equipment Utilized During Treatment: Cervical collar(soft) Nurse Communication: (pt needs  throat lozenges)  Activity Tolerance: Patient tolerated treatment well Patient left: in chair;with call bell/phone within reach  OT Visit Diagnosis: Pain Pain - part of body: (neck)                Time: 4665-9935 OT Time Calculation (min): 24 min Charges:  OT General Charges $OT Visit: 1 Visit OT Evaluation $OT Eval Moderate Complexity: 1 Mod OT Treatments $Self Care/Home Management : 8-22 mins  Ignacia Palma, OTR/L Acute [employer] Pager [phone] Office [phone]

## 2018-01-08 NOTE — Anesthesia Postprocedure Evaluation (Signed)
Anesthesia Post Note  Patient: Alyssa Jimenez  Procedure(s) Performed: Anterior Cervical Decompression/ Discectomy Fusion - Cervical Four-Cervical Five - Cervical Five-Cervical Six - Cervical Six-Cervical Seven (N/A )     Patient location during evaluation: PACU Anesthesia Type: General Level of consciousness: awake and alert Pain management: pain level controlled Vital Signs Assessment: post-procedure vital signs reviewed and stable Respiratory status: spontaneous breathing, nonlabored ventilation, respiratory function stable and patient connected to nasal cannula oxygen Cardiovascular status: blood pressure returned to baseline and stable Postop Assessment: no apparent nausea or vomiting Anesthetic complications: no    Last Vitals:  Vitals:   01/08/18 1130 01/08/18 1158  BP: (!) 144/74 (!) 130/94  Pulse: 93 86  Resp: 19 18  Temp:  37 C  SpO2: 97% 98%    Last Pain:  Vitals:   01/08/18 1158  TempSrc: Oral  PainSc:                  Phillips Grout

## 2018-01-08 NOTE — Anesthesia Preprocedure Evaluation (Addendum)
Anesthesia Evaluation  Patient identified by MRN, date of birth, ID band Patient awake    Reviewed: Allergy & Precautions, NPO status , Patient's Chart, lab work & pertinent test results  Airway Mallampati: II  TM Distance: >3 FB Neck ROM: Full    Dental no notable dental hx.    Pulmonary former smoker,    Pulmonary exam normal breath sounds clear to auscultation       Cardiovascular negative cardio ROS Normal cardiovascular exam Rhythm:Regular Rate:Normal     Neuro/Psych negative neurological ROS  negative psych ROS   GI/Hepatic negative GI ROS, Neg liver ROS,   Endo/Other  negative endocrine ROS  Renal/GU negative Renal ROS  negative genitourinary   Musculoskeletal negative musculoskeletal ROS (+)   Abdominal   Peds negative pediatric ROS (+)  Hematology negative hematology ROS (+)   Anesthesia Other Findings   Reproductive/Obstetrics negative OB ROS                             Anesthesia Physical Anesthesia Plan  ASA: II  Anesthesia Plan: General   Post-op Pain Management:    Induction: Intravenous  PONV Risk Score and Plan: 3 and Ondansetron, Dexamethasone, Midazolam and Treatment may vary due to age or medical condition  Airway Management Planned: Oral ETT  Additional Equipment:   Intra-op Plan:   Post-operative Plan: Extubation in OR  Informed Consent: I have reviewed the patients History and Physical, chart, labs and discussed the procedure including the risks, benefits and alternatives for the proposed anesthesia with the patient or authorized representative who has indicated his/her understanding and acceptance.   Dental advisory given  Plan Discussed with: CRNA  Anesthesia Plan Comments:         Anesthesia Quick Evaluation  

## 2018-01-08 NOTE — Anesthesia Procedure Notes (Signed)
Procedure Name: Intubation Date/Time: 01/08/2018 7:43 AM Performed by: Candis Shine, CRNA Pre-anesthesia Checklist: Patient identified, Emergency Drugs available, Suction available and Patient being monitored Patient Re-evaluated:Patient Re-evaluated prior to induction Oxygen Delivery Method: Circle System Utilized Preoxygenation: Pre-oxygenation with 100% oxygen Induction Type: IV induction Ventilation: Mask ventilation without difficulty Laryngoscope Size: Mac and 3 Grade View: Grade II Tube type: Oral Tube size: 7.0 mm Number of attempts: 1 Airway Equipment and Method: Stylet Placement Confirmation: ETT inserted through vocal cords under direct vision,  positive ETCO2 and breath sounds checked- equal and bilateral Secured at: 21 cm Tube secured with: Tape Dental Injury: Teeth and Oropharynx as per pre-operative assessment

## 2018-01-08 NOTE — H&P (Signed)
Subjective:   Patient is a 57 y.o. female admitted for neck pain. The patient first presented to me with complaints of neck pain and shooting pains in the arm(s). Onset of symptoms was several years ago. The pain is described as aching and occurs all day. The pain is rated severe, and is located in the neck and radiates to the arms at times. The symptoms have been progressive. Symptoms are exacerbated by extending head backwards, and are relieved by none.  Previous work up includes MRI of cervical spine, results: spinal stenosis.  Past Medical History:  Diagnosis Date  . Arthritis    Spine    Past Surgical History:  Procedure Laterality Date  . CESAREAN SECTION      Allergies  Allergen Reactions  . Penicillins Hives and Rash    Has patient had a PCN reaction causing immediate rash, facial/tongue/throat swelling, SOB or lightheadedness with hypotension: Yes Has patient had a PCN reaction causing severe rash involving mucus membranes or skin necrosis: No Has patient had a PCN reaction that required hospitalization: No Has patient had a PCN reaction occurring within the last 10 years: No If all of the above answers are "NO", then may proceed with Cephalosporin use.    Social History   Tobacco Use  . Smoking status: Former Smoker    Years: 25.00    Last attempt to quit: 06/2016    Years since quitting: 1.5  . Smokeless tobacco: Never Used  Substance Use Topics  . Alcohol use: Not Currently    Comment: rare    History reviewed. No pertinent family history. Prior to Admission medications   Medication Sig Start Date End Date Taking? Authorizing Provider  cetirizine (ZYRTEC) 10 MG tablet Take 10 mg by mouth at bedtime as needed for allergies.   Yes [provider]  cyclobenzaprine (FLEXERIL) 10 MG tablet Take 10 mg by mouth at bedtime.   Yes [provider]  diazepam (VALIUM) 5 MG tablet Take 5 mg by mouth 2 (two) times daily.   Yes [provider]   megestrol (MEGACE) 40 MG tablet Take 40 mg by mouth daily.   Yes [provider]  Multiple Vitamins-Minerals (ONE DAILY MULTIVITAMIN WOMEN) TABS Take 1 tablet by mouth daily.   Yes [provider]  naproxen (NAPROSYN) 500 MG tablet Take 500 mg by mouth 2 (two) times daily with a meal.   Yes [provider]  traMADol (ULTRAM) 50 MG tablet Take 50 mg by mouth 2 (two) times daily as needed for moderate pain.   Yes [provider]     Review of Systems  Positive ROS: neg  All other systems have been reviewed and were otherwise negative with the exception of those mentioned in the HPI and as above.  Objective: Vital signs in last 24 hours: Temp:  [97.7 F (36.5 C)] 97.7 F (36.5 C) (09/06 0714) Pulse Rate:  [84] 84 (09/06 0714) Resp:  [20] 20 (09/06 0714) BP: (126)/(85) 126/85 (09/06 0714) SpO2:  [97 %] 97 % (09/06 0714) Weight:  [59.4 kg] 59.4 kg (09/06 0714)  General Appearance: Alert, cooperative, no distress, appears stated age Head: Normocephalic, without obvious abnormality, atraumatic Eyes: PERRL, conjunctiva/corneas clear, EOM's intact      Neck: Supple, symmetrical, trachea midline, Back: Symmetric, no curvature, ROM normal, no CVA tenderness Lungs:  respirations unlabored Heart: Regular rate and rhythm Abdomen: Soft, non-tender Extremities: Extremities normal, atraumatic, no cyanosis or edema Pulses: 2+ and symmetric all extremities Skin: Skin color,  texture, turgor normal, no rashes or lesions  NEUROLOGIC:  Mental status: Alert and oriented x4, no aphasia, good attention span, fund of knowledge and memory  Motor Exam - grossly normal Sensory Exam - grossly normal Reflexes: 1+ Coordination - grossly normal Gait - grossly normal Balance - grossly normal Cranial Nerves: I: smell Not tested  II: visual acuity  OS: nl    OD: nl  II: visual fields Full to confrontation  II: pupils Equal, round, reactive to light  III,VII: ptosis  None  III,IV,VI: extraocular muscles  Full ROM  V: mastication Normal  V: facial light touch sensation  Normal  V,VII: corneal reflex  Present  VII: facial muscle function - upper  Normal  VII: facial muscle function - lower Normal  VIII: hearing Not tested  IX: soft palate elevation  Normal  IX,X: gag reflex Present  XI: trapezius strength  5/5  XI: sternocleidomastoid strength 5/5  XI: neck flexion strength  5/5  XII: tongue strength  Normal    Data Review No results found for: WBC, HGB, HCT, MCV, PLT Lab Results  Component Value Date   NA 141 12/31/2017   K 4.9 12/31/2017   CL 110 12/31/2017   CO2 23 12/31/2017   BUN 12 12/31/2017   CREATININE 0.90 12/31/2017   GLUCOSE 73 12/31/2017   No results found for: INR, PROTIME  Assessment:   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 Estimated body mass index is 23.96 kg/m as calculated from the following:   Height as of this encounter: 5\' 2"  (1.575 m).   Weight as of this encounter: 59.4 kg.  Patient has failed conservative therapy. Planned surgery : ACDF C4-5 C5-6 C6-7  Plan:   I explained the condition and procedure to the patient and answered any questions.  Patient wishes to proceed with procedure as planned. Understands risks/ benefits/ and expected or typical outcomes.  Alyssa Jimenez S 01/08/2018 7:22 AM

## 2018-01-09 DIAGNOSIS — M47812 Spondylosis without myelopathy or radiculopathy, cervical region: Secondary | ICD-10-CM | POA: Diagnosis not present

## 2018-01-09 MED ORDER — OXYCODONE HCL 5 MG PO TABS: 5.0000 mg | ORAL_TABLET | ORAL | 0 refills | Status: AC | PRN

## 2018-01-09 NOTE — Progress Notes (Signed)
Patient alert and oriented, mae's well, voiding adequate amount of urine, swallowing without difficulty,  c/o mild pain at time of discharge and ice pack given to help with soreness. Patient discharged home with family. Script and discharged instructions given to patient. Patient and family stated understanding of instructions given. Patient has an appointment with Dr. Yetta Barre

## 2018-01-09 NOTE — Discharge Instructions (Signed)

## 2018-01-09 NOTE — Discharge Summary (Signed)
  Physician Discharge Summary  Patient ID: Alyssa Jimenez MRN: [id number] DOB/AGE: [birth year] 57 y.o. Estimated body mass index is 23.96 kg/m as calculated from the following:   Height as of this encounter: 5\' 2"  (1.575 m).   Weight as of this encounter: 59.4 kg.   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  Postoperatively patient did very well recovering the floor on the floor was ablating voiding spent tolerating regular diet and stable for discharge home.  Patient be discharged scheduled follow-up in 1 to 2 weeks.  Consults: Significant Diagnostic Studies: Treatments: ACDF C5-6 Discharge Exam: Blood pressure 127/67, pulse 83, temperature 98.6 F (37 C), temperature source Oral, resp. rate 18, height 5\' 2"  (1.575 m), weight 59.4 kg, SpO2 100 %. Strength 5 out of 5 wound clean dry and intact  Disposition: Home   Allergies as of 01/09/2018      Reactions   Penicillins Hives, Rash   Has patient had a PCN reaction causing immediate rash, facial/tongue/throat swelling, SOB or lightheadedness with hypotension: Yes Has patient had a PCN reaction causing severe rash involving mucus membranes or skin necrosis: No Has patient had a PCN reaction that required hospitalization: No Has patient had a PCN reaction occurring within the last 10 years: No If all of the above answers are "NO", then may proceed with Cephalosporin use.      Medication List    TAKE these medications   cetirizine 10 MG tablet Commonly known as:  ZYRTEC Take 10 mg by mouth at bedtime as needed for allergies.   cyclobenzaprine 10 MG tablet Commonly known as:  FLEXERIL Take 10 mg by mouth at bedtime.   diazepam 5 MG tablet Commonly known  as:  VALIUM Take 5 mg by mouth 2 (two) times daily.   megestrol 40 MG tablet Commonly known as:  MEGACE Take 40 mg by mouth daily.   naproxen 500 MG tablet Commonly known as:  NAPROSYN Take 500 mg by mouth 2 (two) times daily with a meal.   ONE DAILY MULTIVITAMIN WOMEN Tabs Take 1 tablet by mouth daily.   oxyCODONE 5 MG immediate release tablet Commonly known as:  Oxy IR/ROXICODONE Take 1 tablet (5 mg total) by mouth every 3 (three) hours as needed for severe pain.   traMADol 50 MG tablet Commonly known as:  ULTRAM Take 50 mg by mouth 2 (two) times daily as needed for moderate pain.        Signed: Taesha Goodell P 01/09/2018, 8:00 AM

## 2018-01-11 ENCOUNTER — Encounter (HOSPITAL_COMMUNITY): Payer: Self-pay | Admitting: Neurological Surgery

## 2018-01-11 MED FILL — Thrombin For Soln Kit 20000 Unit: CUTANEOUS | Qty: 1 | Status: AC

## 2018-01-16 ENCOUNTER — Other Ambulatory Visit: Payer: Self-pay | Admitting: Neurological Surgery

## 2018-01-16 DIAGNOSIS — B370 Candidal stomatitis: Secondary | ICD-10-CM | POA: Insufficient documentation

## 2018-01-16 MED ORDER — FLUCONAZOLE 100 MG PO TABS: 100.0000 mg | ORAL_TABLET | Freq: Every day | ORAL | 0 refills | Status: AC

## 2018-01-16 NOTE — Assessment & Plan Note (Signed)
Pt called and is having raised areas of white plaque in her mouth. She stated that she has had this before after getting antibiotics and it was diagnosed as thrush, she feels that this is the same. She said that she is still maintaining good po fluid intake, but has to drink slowly. Will call in course of fluconazole.

## 2018-02-16 DIAGNOSIS — M542 Cervicalgia: Secondary | ICD-10-CM | POA: Diagnosis not present

## 2019-09-08 IMAGING — CR DG CHEST 2V
2 series · 2 of 2 positions shown · non-contrast
Comparison: None.

CLINICAL DATA: Preoperative evaluation for upcoming neck surgery

EXAM:
CHEST - 2 VIEW

[w chest pa]
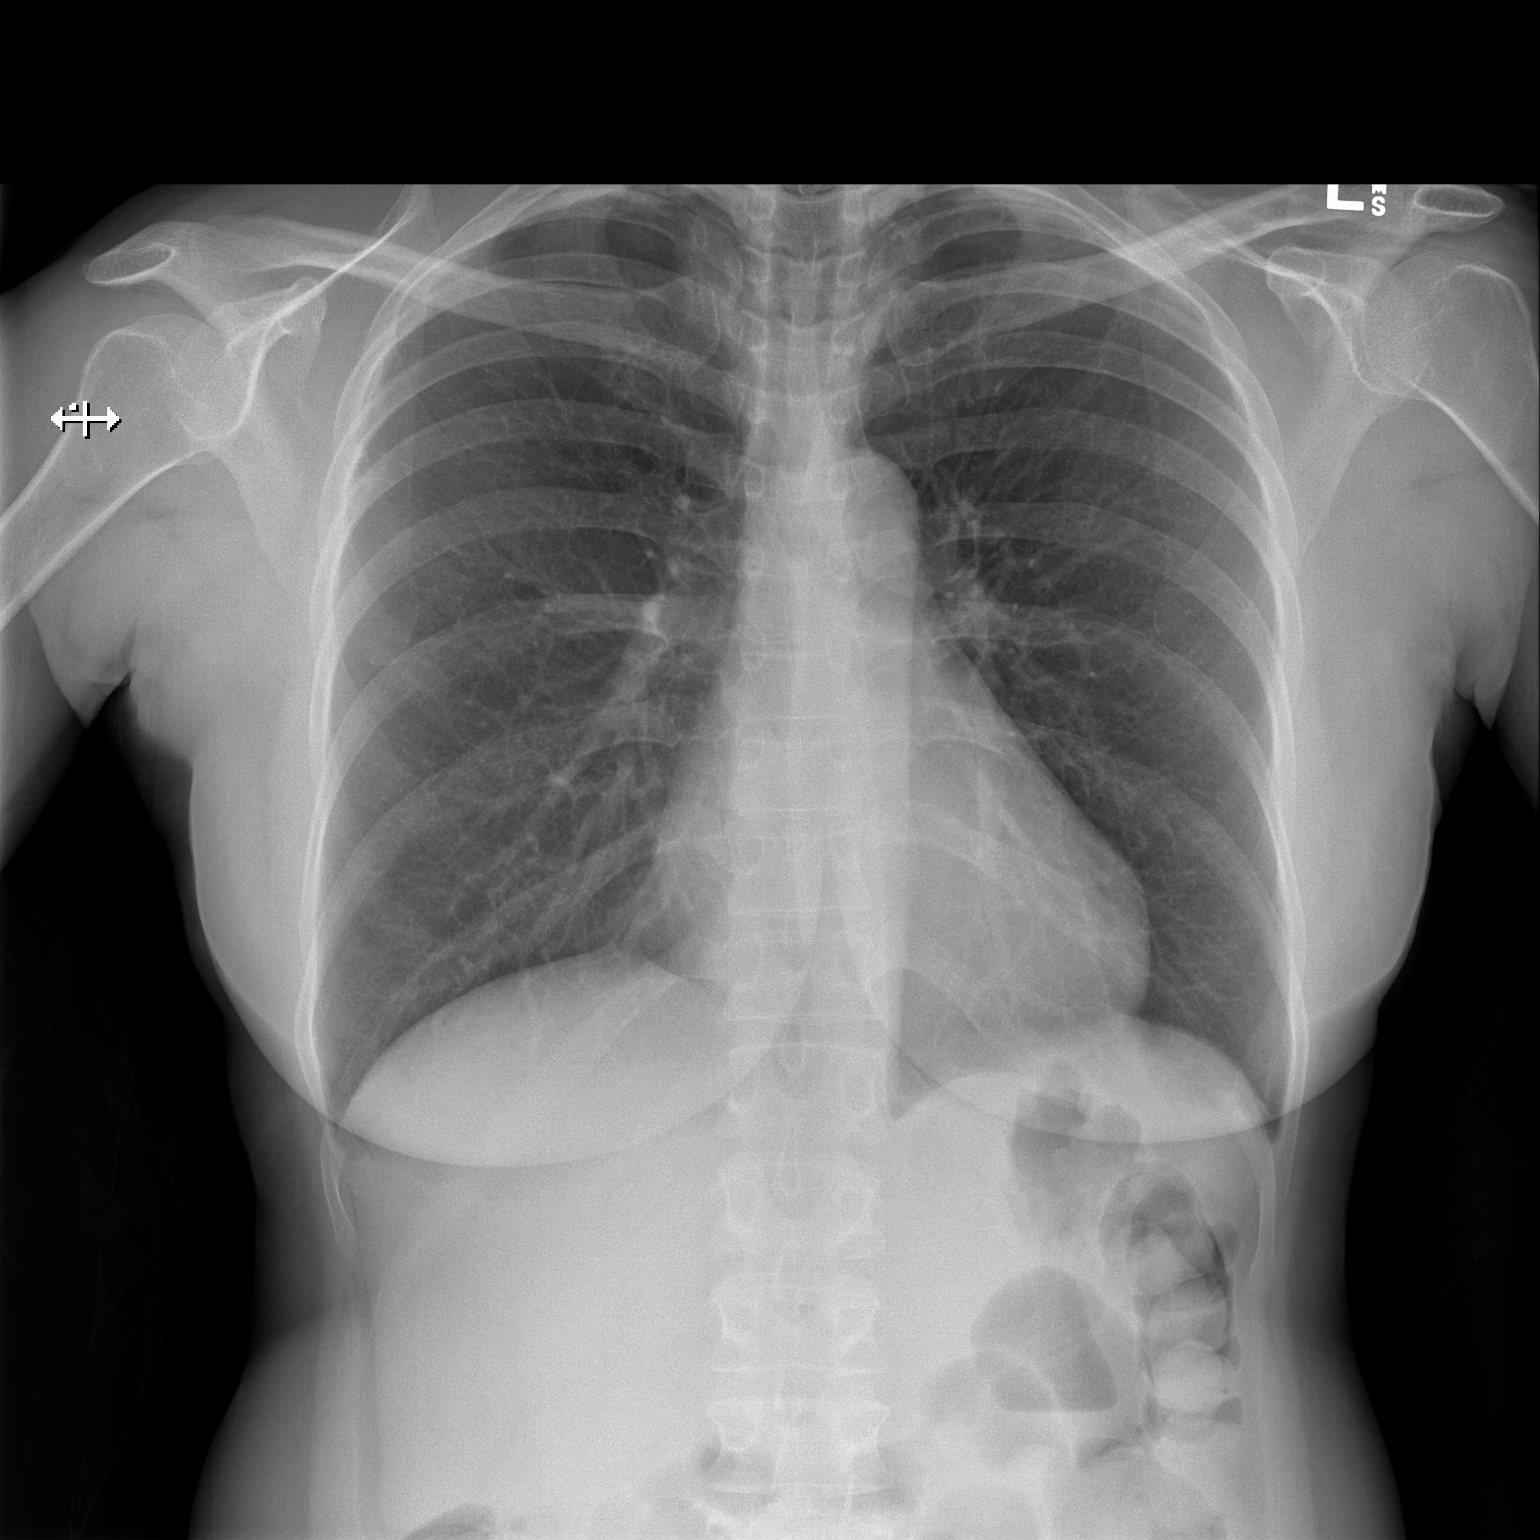

[w chest lat]
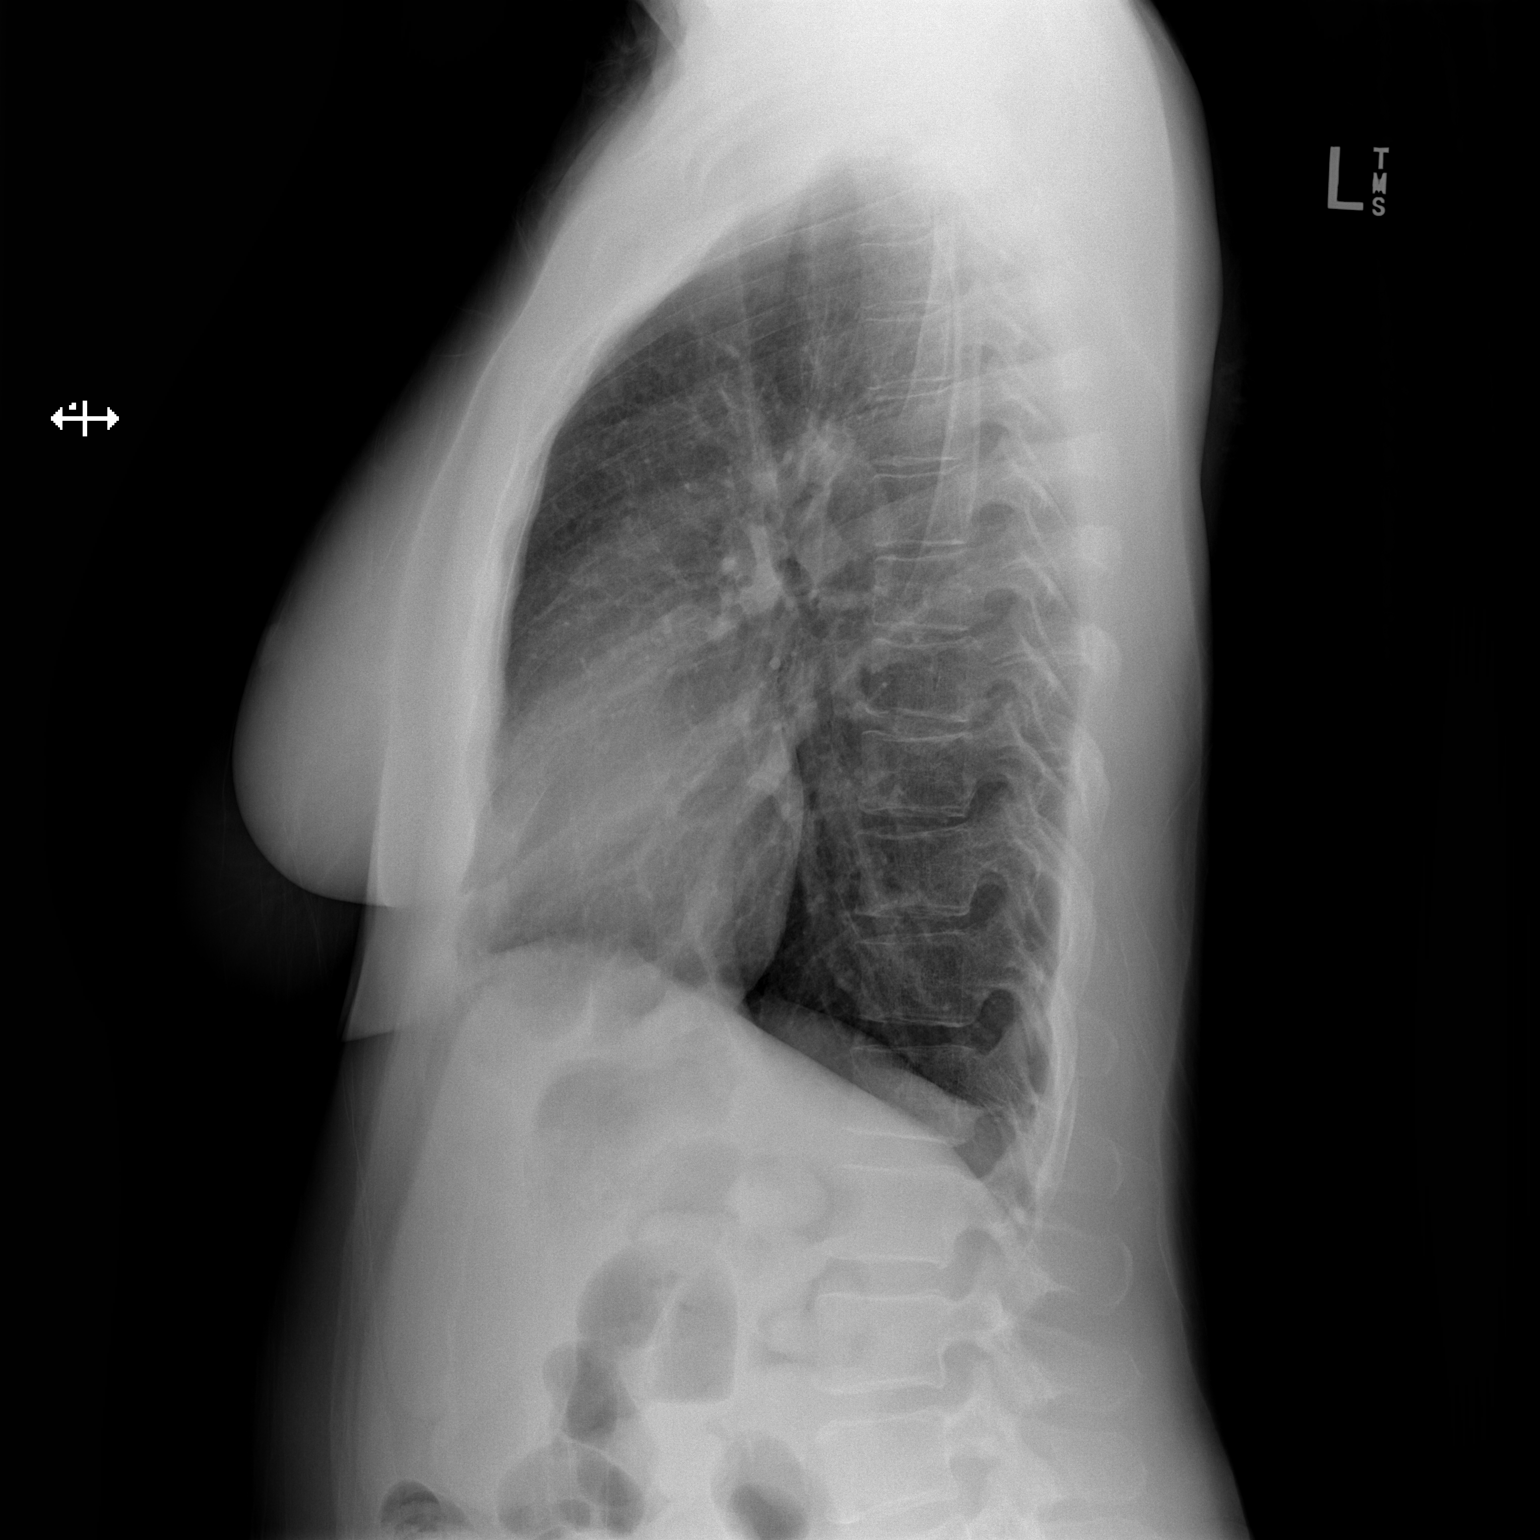

[2 of 2 positions shown; findings below may reference images not displayed]

FINDINGS: The heart size and mediastinal contours are within normal limits.
Both lungs are clear. The visualized skeletal structures are
unremarkable.
IMPRESSION: No active cardiopulmonary disease.

## 2020-01-09 ENCOUNTER — Emergency Department (HOSPITAL_COMMUNITY)
Admission: EM | Admit: 2020-01-09 | Discharge: 2020-01-09 | Disposition: A | Discharge status: Home / Self Care | Payer: Medicare HMO | Attending: Emergency Medicine | Admitting: Emergency Medicine

## 2020-01-09 ENCOUNTER — Other Ambulatory Visit: Payer: Self-pay

## 2020-01-09 DIAGNOSIS — Z79899 Other long term (current) drug therapy: Secondary | ICD-10-CM | POA: Diagnosis not present

## 2020-01-09 DIAGNOSIS — U071 COVID-19: Secondary | ICD-10-CM | POA: Diagnosis not present

## 2020-01-09 DIAGNOSIS — Y9289 Other specified places as the place of occurrence of the external cause: Secondary | ICD-10-CM | POA: Insufficient documentation

## 2020-01-09 DIAGNOSIS — Z87891 Personal history of nicotine dependence: Secondary | ICD-10-CM | POA: Insufficient documentation

## 2020-01-09 DIAGNOSIS — S0006XA Insect bite (nonvenomous) of scalp, initial encounter: Secondary | ICD-10-CM | POA: Diagnosis not present

## 2020-01-09 DIAGNOSIS — Y998 Other external cause status: Secondary | ICD-10-CM | POA: Diagnosis not present

## 2020-01-09 DIAGNOSIS — Y93H9 Activity, other involving exterior property and land maintenance, building and construction: Secondary | ICD-10-CM | POA: Diagnosis not present

## 2020-01-09 DIAGNOSIS — W57XXXA Bitten or stung by nonvenomous insect and other nonvenomous arthropods, initial encounter: Secondary | ICD-10-CM | POA: Diagnosis not present

## 2020-01-09 LAB — SARS CORONAVIRUS 2 BY RT PCR (HOSPITAL ORDER, PERFORMED IN ~~LOC~~ HOSPITAL LAB): SARS Coronavirus 2: POSITIVE — AB

## 2020-01-09 MED ORDER — DEXAMETHASONE SODIUM PHOSPHATE 10 MG/ML IJ SOLN
6.0000 mg | Freq: Once | INTRAMUSCULAR | Status: AC
  Administered 2020-01-09: 6 mg via INTRAVENOUS
  Filled 2020-01-09: qty 1

## 2020-01-09 NOTE — ED Provider Notes (Signed)
[facility] EMERGENCY DEPARTMENT Provider Note   CSN: [id number] Arrival date & time: 01/09/20  7902     History Chief Complaint  Patient presents with  . Covid Exposure  . Insect Bite    Alyssa Jimenez is a 59 y.o. female with past medical history significant for arthritis. Did not have covid vaccinations, was supposed to have them last week.  HPI Patient presents to emergency department today with chief complaint of Covid exposure x3 days ago.  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  Patient is also endorsing an insect bite.  She states yesterday she was cleaning out her car and there was a wasp flying around that stung her on her right forehead.  She noticed swelling and redness with aching pain localized to the bite.  Last night she noticed that her throat felt funny.  She describes them as feeling like she has a cold in her throat.  She has been drinking tea with symptom improvement.  She also took Tylenol and that has resolved the pain from her insect bite.  She denies any fever, chills, congestion, cough, voice change, shortness of breath, chest pain, abdominal pain, nausea, emesis, urinary symptoms, diarrhea.     Past Medical History:  Diagnosis Date  . Arthritis    Spine    Patient Active Problem List   Diagnosis Date Noted  . Thrush 01/16/2018  . Cervical vertebral fusion 01/08/2018    Past Surgical History:  Procedure Laterality Date  . ANTERIOR CERVICAL DECOMP/DISCECTOMY FUSION N/A 01/08/2018   Procedure: Anterior Cervical Decompression/ Discectomy Fusion - Cervical Four-Cervical Five - Cervical Five-Cervical Six - Cervical Six-Cervical Seven;  Surgeon: Tia Alert, MD;  Location: [facility] OR;  Service: Neurosurgery;  Laterality: N/A;  Anterior Cervical Decompression/ Discectomy Fusion - Cervical Four-Cervical Five - Cervical Five-Cervical Six - Cervical Six-Cervical Seven  .  CESAREAN SECTION       OB History   No obstetric history on file.     No family history on file.  Social History   Tobacco Use  . Smoking status: Former Smoker    Years: 25.00    Quit date: 06/2016    Years since quitting: 3.5  . Smokeless tobacco: Never Used  Vaping Use  . Vaping Use: Never used  Substance Use Topics  . Alcohol use: Not Currently    Comment: rare  . Drug use: Never    Home Medications Prior to Admission medications   Medication Sig Start Date End Date Taking? Authorizing Provider  cetirizine (ZYRTEC) 10 MG tablet Take 10 mg by mouth at bedtime as needed for allergies.    [provider]  cyclobenzaprine (FLEXERIL) 10 MG tablet Take 10 mg by mouth at bedtime.    [provider]  diazepam (VALIUM) 5 MG tablet Take 5 mg by mouth 2 (two) times daily.    [provider]  megestrol (MEGACE) 40 MG tablet Take 40 mg by mouth daily.    [provider]  Multiple Vitamins-Minerals (ONE DAILY MULTIVITAMIN WOMEN) TABS Take 1 tablet by mouth daily.    [provider]  naproxen (NAPROSYN) 500 MG tablet Take 500 mg by mouth 2 (two) times daily with a meal.    [provider]  oxyCODONE (OXY IR/ROXICODONE) 5 MG immediate release tablet Take 1 tablet (5 mg total) by mouth every 3 (three) hours as needed for severe pain. 01/09/18  Donalee Citrin, MD  traMADol (ULTRAM) 50 MG tablet Take 50 mg by mouth 2 (two) times daily as needed for moderate pain.    [provider]    Allergies    Penicillins  Review of Systems   Review of Systems  All other systems are reviewed and are negative for acute change except as noted in the HPI.   Physical Exam Updated Vital Signs BP 125/89 (BP Location: Right Arm)   Pulse 91   Temp 98.8 F (37.1 C) (Oral)   Resp 16   Ht 5' (1.524 m)   Wt 58.5 kg   SpO2 100%   BMI 25.19 kg/m   Physical Exam Vitals and nursing note reviewed.  Constitutional:      Appearance: She is  well-developed. She is not ill-appearing or toxic-appearing.     Comments: Airway is intact. No signs of swelling or oral mucosa, no angioedema noted  HENT:     Head: Normocephalic and atraumatic.      Nose: Nose normal.     Mouth/Throat:     Pharynx: Uvula midline. No uvula swelling.     Comments: No erythema to oropharynx, no edema, no exudate, no tonsillar swelling, voice normal, neck supple without lymphadenopathy  Eyes:     General: No scleral icterus.       Right eye: No discharge.        Left eye: No discharge.     Conjunctiva/sclera: Conjunctivae normal.  Neck:     Vascular: No JVD.  Cardiovascular:     Rate and Rhythm: Normal rate and regular rhythm.     Pulses: Normal pulses.     Heart sounds: Normal heart sounds.  Pulmonary:     Effort: Pulmonary effort is normal. No respiratory distress.     Breath sounds: Normal breath sounds. No wheezing or rales.     Comments: Oxygen saturation is 100% on room air.  She is speaking in full sentences. Abdominal:     General: There is no distension.  Musculoskeletal:        General: Normal range of motion.     Cervical back: Normal range of motion.  Skin:    General: Skin is warm and dry.     Findings: No rash.  Neurological:     Mental Status: She is oriented to person, place, and time.     GCS: GCS eye subscore is 4. GCS verbal subscore is 5. GCS motor subscore is 6.     Comments: Fluent speech, no facial droop.  Psychiatric:        Behavior: Behavior normal.     ED Results / Procedures / Treatments   Labs (all labs ordered are listed, but only abnormal results are displayed) Labs Reviewed  SARS CORONAVIRUS 2 BY RT PCR (HOSPITAL ORDER, PERFORMED IN [facility]) - Abnormal; Notable for the following components:      Result Value   SARS Coronavirus 2 POSITIVE (*)    All other components within normal limits    EKG None  Radiology No results found.  Procedures Procedures (including critical care  time)  Medications Ordered in ED Medications  dexamethasone (DECADRON) injection 6 mg (6 mg Intravenous Given 01/09/20 1143)    ED Course  I have reviewed the triage vital signs and the nursing notes.  Pertinent labs & imaging results that were available during my care of the patient were reviewed by me and considered in my medical decision making (see chart for details).  MDM Rules/Calculators/A&P                          History provided by patient with additional history obtained from chart review.    I have reviewed patient's EMR to obtain pertinent PMH to assist in MDM.  Symptoms and exam most suggestive of uncomplicated viral illness. DDX incluldes viral URI/LRI, COVID-19.  No travel. Recent covid exposure.   Exam is benign.  Normal WOB. No fever, tachypnea, tachycardia, hypoxemia. Lungs are CTAB. I do not think that a CXR is indicated at this time as VS are WNL, there are no signs of consolidation on auscultation and there is no hypoxia, increased WOB or other concerning features to exam. No significant h/o immunocompromise. Doubt bacterial bronchitis or pneumonia.  No signs or symptoms to suggest strep pharyngitis.  No clinical signs of severe illness, dehydration, to warrant further emergent work up in ER. Covid test is positive. Patient ambulated in the emergency department without respiratory distress or hypoxia, SpO2 >95% on room air. Area of erythema on forehead without signs of infection, no gross abscess.  Given reassuring physical exam, symptoms, will discharge with symptomatic treatment. Recommend telemedicine PCP f/u in the next 2-3 days for persistent symptoms  for further guidance. Self-isolation instructions discussed. Pt was given home self-isolation instructions and instructions for family members.   Pt understands signs and symptoms that would warrant return to ED.  Pt comfortable and agreeable with POC.  Alyssa Jimenez was evaluated in Emergency Department on  01/09/2020 for the symptoms described in the history of present illness. She was evaluated in the context of the global COVID-19 pandemic, which necessitated consideration that the patient might be at risk for infection with the SARS-CoV-2 virus that causes COVID-19. Institutional protocols and algorithms that pertain to the evaluation of patients at risk for COVID-19 are in a state of rapid change based on information released by regulatory bodies including the CDC and federal and state organizations. These policies and algorithms were followed during the patient's care in the ED.   Portions of this note were generated with Scientist, clinical (histocompatibility and immunogenetics). Dictation errors may occur despite best attempts at proofreading.   Final Clinical Impression(s) / ED Diagnoses Final diagnoses:  COVID-19 virus infection  Insect bite of scalp, initial encounter    Rx / DC Orders ED Discharge Orders    None       Sherene Sires, PA-C 01/09/20 1227    Rolan Bucco, MD 01/09/20 [phone]

## 2020-01-09 NOTE — ED Notes (Signed)
Pt ambulated around the bed a couple of times with her SpO2 staying around 96-98 and the patient stated that she "didn't feel out of breath" and she "felt fine".

## 2020-01-09 NOTE — Discharge Instructions (Signed)
Thank you for allowing [place] to care for you today.   Please return to the emergency department if you have any new or worsening symptoms.  You tested positive for covid-19 today.   Medications- You can take medications to help treat your symptoms: -Tylenol for fever and body aches. Please take as prescribed on the bottle. -Over the coutner cough medicine such as mucinex, robitussin, or other brands. -Flonase or saline nasal spray for nasal congestion -Vitamins as recommended by CDC  Treatment- This is a virus and unfortunately there are no antibitotics approved to treat this virus at this time. It is important to monitor your symptoms closely: -You should have a theremometer at home to check your temperature when feeling feverish. -Use a pulse ox meter to measure your oxygen when feeling short of breath.  -If your fever is over 100.4 despite taking tylenol or if your oxygen level drops below 94% these are reasons to return to the emergency department for further evaluation. Please call the emergency department before you come to make [place] aware.    We recommend you self-isolate for 10 days and to inform your work/family/friends that you has the virus.  They will need to self-quarantine for 14 days to monitor for symptoms.    Again: symptoms of shortness of breath, chest pain, difficulty breathing, new onset of confusion, any symptoms that are concerning. If any of these symptoms you should come to emergency department for evaluation.   I hope you feel better soon

## 2020-01-09 NOTE — ED Triage Notes (Signed)
Pt requesting covid test after being exposed on Friday. Denies symptoms. Also sts she got stung by a wasp last night and her throat "feels funny" and has pain to site of sting (R forehead). Airway intact, no swelling noted. Speaking in full sentences.

## 2020-01-10 ENCOUNTER — Other Ambulatory Visit: Payer: Self-pay | Admitting: Nurse Practitioner

## 2020-01-10 NOTE — Progress Notes (Signed)
COVID -19 [facility]   Symptoms: Rhinorrhea Symptom Onset: 2 days Qualifiers: At risk population, BMI >25, non-vaccinated   I connected with the patient by telephone this morning to discuss her symptoms since diagnosis with COVID-19 yesterday at [facility] ED. Patient reports she feels great today. She denies any symptoms with the exception of slightly rhinorrhea. Provided the patient with hotline number for call back if she begins to develop symptoms.   If shortness of breath or difficulty breathing present, patient encouraged to seek emergency medical attention immediately.   Shawna Clamp, DNP, AGNP-c COVID-19 [facility] [phone]

## 2020-02-23 DIAGNOSIS — M130 Polyarthritis, unspecified: Secondary | ICD-10-CM | POA: Diagnosis not present

## 2020-02-23 DIAGNOSIS — E782 Mixed hyperlipidemia: Secondary | ICD-10-CM | POA: Diagnosis not present

## 2020-02-23 DIAGNOSIS — Z0131 Encounter for examination of blood pressure with abnormal findings: Secondary | ICD-10-CM | POA: Diagnosis not present

## 2020-02-24 DIAGNOSIS — M130 Polyarthritis, unspecified: Secondary | ICD-10-CM | POA: Diagnosis not present

## 2020-02-24 DIAGNOSIS — Z0131 Encounter for examination of blood pressure with abnormal findings: Secondary | ICD-10-CM | POA: Diagnosis not present

## 2020-02-24 DIAGNOSIS — E782 Mixed hyperlipidemia: Secondary | ICD-10-CM | POA: Diagnosis not present

## 2020-05-23 DIAGNOSIS — H1789 Other corneal scars and opacities: Secondary | ICD-10-CM | POA: Diagnosis not present

## 2020-05-23 DIAGNOSIS — H2513 Age-related nuclear cataract, bilateral: Secondary | ICD-10-CM | POA: Diagnosis not present

## 2020-05-23 DIAGNOSIS — H52203 Unspecified astigmatism, bilateral: Secondary | ICD-10-CM | POA: Diagnosis not present

## 2020-05-23 DIAGNOSIS — H5203 Hypermetropia, bilateral: Secondary | ICD-10-CM | POA: Diagnosis not present

## 2020-07-21 DIAGNOSIS — Z87891 Personal history of nicotine dependence: Secondary | ICD-10-CM | POA: Diagnosis not present

## 2020-07-21 DIAGNOSIS — Z880 Allergy status to penicillin: Secondary | ICD-10-CM | POA: Diagnosis not present

## 2020-07-21 DIAGNOSIS — Z7722 Contact with and (suspected) exposure to environmental tobacco smoke (acute) (chronic): Secondary | ICD-10-CM | POA: Diagnosis not present

## 2020-07-21 DIAGNOSIS — R030 Elevated blood-pressure reading, without diagnosis of hypertension: Secondary | ICD-10-CM | POA: Diagnosis not present

## 2020-07-21 DIAGNOSIS — Z823 Family history of stroke: Secondary | ICD-10-CM | POA: Diagnosis not present

## 2020-07-21 DIAGNOSIS — Z809 Family history of malignant neoplasm, unspecified: Secondary | ICD-10-CM | POA: Diagnosis not present

## 2020-07-21 DIAGNOSIS — Z833 Family history of diabetes mellitus: Secondary | ICD-10-CM | POA: Diagnosis not present

## 2020-07-21 DIAGNOSIS — Z887 Allergy status to serum and vaccine status: Secondary | ICD-10-CM | POA: Diagnosis not present

## 2020-07-21 DIAGNOSIS — Z8249 Family history of ischemic heart disease and other diseases of the circulatory system: Secondary | ICD-10-CM | POA: Diagnosis not present

## 2020-07-25 DIAGNOSIS — Z0000 Encounter for general adult medical examination without abnormal findings: Secondary | ICD-10-CM | POA: Diagnosis not present

## 2020-09-05 DIAGNOSIS — Z1231 Encounter for screening mammogram for malignant neoplasm of breast: Secondary | ICD-10-CM | POA: Diagnosis not present

## 2020-11-07 ENCOUNTER — Ambulatory Visit (INDEPENDENT_AMBULATORY_CARE_PROVIDER_SITE_OTHER): Payer: Medicare HMO | Admitting: Podiatrist

## 2020-11-07 ENCOUNTER — Other Ambulatory Visit: Payer: Self-pay

## 2020-11-07 DIAGNOSIS — L603 Nail dystrophy: Secondary | ICD-10-CM | POA: Diagnosis not present

## 2020-11-07 DIAGNOSIS — M79676 Pain in unspecified toe(s): Secondary | ICD-10-CM

## 2020-11-07 DIAGNOSIS — B351 Tinea unguium: Secondary | ICD-10-CM

## 2020-11-07 NOTE — Patient Instructions (Signed)
Terbinafine tablets What is this medication? TERBINAFINE (TER bin a feen) is an antifungal medicine. It is used to treatcertain kinds of fungal or yeast infections. This medicine may be used for other purposes; ask your health care provider orpharmacist if you have questions. COMMON BRAND NAME(S): Lamisil, Terbinex What should I tell my care team before I take this medication? They need to know if you have any of these conditions: drink alcoholic beverages kidney disease liver disease an unusual or allergic reaction to terbinafine, other medicines, foods, dyes, or preservatives pregnant or trying to get pregnant breast-feeding How should I use this medication? Take this medicine by mouth with a full glass of water. Follow the directions on the prescription label. You can take this medicine with food or on an empty stomach. Take your medicine at regular intervals. Do not take your medicine more often than directed. Do not skip doses or stop your medicine early even ifyou feel better. Do not stop taking except on your doctor's advice. A special MedGuide will be given to you by the pharmacist with eachprescription and refill. Be sure to read this information carefully each time. Talk to your pediatrician regarding the use of this medicine in children.Special care may be needed. Overdosage: If you think you have taken too much of this medicine contact apoison control center or emergency room at once. NOTE: This medicine is only for you. Do not share this medicine with others. What if I miss a dose? If you miss a dose, take it as soon as you can. If it is almost time for yournext dose, take only that dose. Do not take double or extra doses. What may interact with this medication? Do not take this medicine with any of the following medications: thioridazine This medicine may also interact with the following medications: beta-blockers caffeine cimetidine cyclosporine medicines for depression,  anxiety, or psychotic disturbances medicines for fungal infections like fluconazole and ketoconazole medicines for irregular heartbeat like amiodarone, flecainide and propafenone rifampin warfarin This list may not describe all possible interactions. Give your health care provider a list of all the medicines, herbs, non-prescription drugs, or dietary supplements you use. Also tell them if you smoke, drink alcohol, or use illegaldrugs. Some items may interact with your medicine. What should I watch for while using this medication? Visit your doctor or health care provider regularly. Tell your doctor right away if you have nausea or vomiting, loss of appetite, stomach pain on your right upper side, yellow skin, dark urine, light stools, or are over tired. Some fungal infections need many weeks or months of treatment to cure. If you are taking this medicine for a long time, you will need to have important bloodwork done. This medicine may cause serious skin reactions. They can happen weeks to months after starting the medicine. Contact your health care provider right away if you notice fevers or flu-like symptoms with a rash. The rash may be red or purple and then turn into blisters or peeling of the skin. Or, you might notice a red rash with swelling of the face, lips or lymph nodes in your neck or underyour arms. What side effects may I notice from receiving this medication? Side effects that you should report to your doctor or health care professionalas soon as possible: allergic reactions like skin rash or hives, swelling of the face, lips, or tongue changes in vision dark urine fever or infection general ill feeling or flu-like symptoms light-colored stools loss of appetite, nausea rash, fever,   and swollen lymph nodes redness, blistering, peeling or loosening of the skin, including inside the mouth right upper belly pain unusually weak or tired yellowing of the eyes or skin Side effects that  usually do not require medical attention (report to yourdoctor or health care professional if they continue or are bothersome): changes in taste diarrhea hair loss muscle or joint pain stomach gas stomach upset This list may not describe all possible side effects. Call your doctor for medical advice about side effects. You may report side effects to FDA at1-800-FDA-1088. Where should I keep my medication? Keep out of the reach of children. Store at room temperature below 25 degrees C (77 degrees F). Protect fromlight. Throw away any unused medicine after the expiration date. NOTE: This sheet is a summary. It may not cover all possible information. If you have questions about this medicine, talk to your doctor, pharmacist, orhealth care provider.  2022 Elsevier/Gold Standard (2018-07-30 15:37:07)  

## 2020-11-14 ENCOUNTER — Encounter: Payer: Self-pay | Admitting: Podiatrist

## 2020-11-14 NOTE — Progress Notes (Signed)
Chief Complaint  Patient presents with   Nail Problem    Rt hallux medial order, Rt 2nd medial border and Lt 3rd lateral border x 2-3 yrs - no redness/swelling/drainage -7/10 sharp pains -worse with prolong standing/walking Tx: epsom salt and trimming     HPI: Patient is 60 y.o. female who presents today for the concerns as listed above. She relates pain in several of the toenails and states they are painful with standing and walking.  She has tried trimming back the nails with no relief.   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     cyclobenzaprine (FLEXERIL) 10 MG tablet Take 10 mg by mouth at bedtime.     diazepam (VALIUM) 5 MG tablet Take 5 mg by mouth 2 (two) times daily.     megestrol (MEGACE) 40 MG tablet Take 40 mg by mouth daily.     Multiple Vitamins-Minerals (ONE DAILY MULTIVITAMIN WOMEN) TABS Take 1 tablet by mouth daily.     naproxen (NAPROSYN) 500 MG tablet Take 500 mg by mouth 2 (two) times daily with a meal.     oxyCODONE (OXY IR/ROXICODONE) 5 MG immediate release tablet Take 1 tablet (5 mg total) by mouth every 3 (three) hours as needed for severe pain. 40 tablet 0   traMADol (ULTRAM) 50 MG tablet Take 50 mg by mouth 2 (two) times daily as needed for moderate pain.     No current facility-administered medications on file prior to visit.    Allergies  Allergen Reactions   Penicillins Hives and Rash    Has patient had a PCN reaction causing immediate rash, facial/tongue/throat swelling, SOB or lightheadedness with hypotension: Yes Has patient had a PCN reaction causing severe rash involving mucus membranes or skin necrosis: No Has patient had a PCN reaction that required hospitalization: No Has patient had a PCN reaction  occurring within the last 10 years: No If all of the above answers are "NO", then may proceed with Cephalosporin use.    Review of Systems No fevers, chills, nausea, muscle aches, no difficulty breathing, no calf pain, no chest pain or shortness of breath.   Physical Exam  GENERAL APPEARANCE: Alert, conversant. Appropriately groomed. No acute distress.   VASCULAR: Pedal pulses palpable DP and PT bilateral.  Capillary refill time is immediate to all digits,  Proximal to distal cooling it warm to warm.  Digital perfusion adequate.   NEUROLOGIC: sensation is intact to 5.07 monofilament at 5/5 sites bilateral.  Light touch is intact bilateral, vibratory sensation intact bilateral  MUSCULOSKELETAL: acceptable muscle strength, tone and stability bilateral.  No gross boney pedal deformities noted.  No pain, crepitus or limitation noted with foot and ankle range of motion bilateral.   DERMATOLOGIC: skin is warm, supple, and dry.  No open lesions noted.  No rash, no pre ulcerative lesions. Digital nails Right first, right second and left third appear thickened with discoloration and subungual debris noted.  No redness no swelling, no sign of bacterial infection noted.      Assessment   1. Pain due to onychomycosis of toenail      Plan  Discussed exam findings with the patient.  The pain seems to be from the thickness of the affected  nails.  I took a sample of the left hallux nail to send to [facility] for culture and identification of pathogen causing the thickness.  Will call with the result and determine treatment options at that time.  We did discuss lamisil if indicated and the patient is interested.  I will call with culture result when available.

## 2020-11-28 ENCOUNTER — Other Ambulatory Visit: Payer: Self-pay | Admitting: *Deleted

## 2020-11-28 DIAGNOSIS — B351 Tinea unguium: Secondary | ICD-10-CM

## 2020-11-28 DIAGNOSIS — M869 Osteomyelitis, unspecified: Secondary | ICD-10-CM

## 2020-11-28 NOTE — Progress Notes (Signed)
nail 

## 2021-01-14 DIAGNOSIS — E782 Mixed hyperlipidemia: Secondary | ICD-10-CM | POA: Diagnosis not present

## 2021-01-14 DIAGNOSIS — M130 Polyarthritis, unspecified: Secondary | ICD-10-CM | POA: Diagnosis not present

## 2021-01-14 DIAGNOSIS — Z0131 Encounter for examination of blood pressure with abnormal findings: Secondary | ICD-10-CM | POA: Diagnosis not present

## 2021-01-14 DIAGNOSIS — M79671 Pain in right foot: Secondary | ICD-10-CM | POA: Diagnosis not present

## 2021-01-25 ENCOUNTER — Ambulatory Visit: Payer: Medicare HMO | Admitting: Podiatrist

## 2021-01-25 ENCOUNTER — Ambulatory Visit (INDEPENDENT_AMBULATORY_CARE_PROVIDER_SITE_OTHER): Payer: Medicare HMO

## 2021-01-25 ENCOUNTER — Encounter: Payer: Self-pay | Admitting: Podiatrist

## 2021-01-25 ENCOUNTER — Other Ambulatory Visit: Payer: Self-pay

## 2021-01-25 DIAGNOSIS — B351 Tinea unguium: Secondary | ICD-10-CM | POA: Diagnosis not present

## 2021-01-25 DIAGNOSIS — M778 Other enthesopathies, not elsewhere classified: Secondary | ICD-10-CM | POA: Diagnosis not present

## 2021-01-25 DIAGNOSIS — M779 Enthesopathy, unspecified: Secondary | ICD-10-CM

## 2021-01-25 DIAGNOSIS — M79671 Pain in right foot: Secondary | ICD-10-CM

## 2021-01-25 DIAGNOSIS — Z79899 Other long term (current) drug therapy: Secondary | ICD-10-CM

## 2021-01-25 MED ORDER — TERBINAFINE HCL 250 MG PO TABS: 250.0000 mg | ORAL_TABLET | Freq: Every day | ORAL | 0 refills | Status: AC

## 2021-01-25 MED ORDER — TRIAMCINOLONE ACETONIDE 10 MG/ML IJ SUSP: 10.0000 mg | Freq: Once | INTRAMUSCULAR | Status: AC

## 2021-01-25 NOTE — Progress Notes (Signed)
Chief Complaint  Patient presents with   Foot Pain    Right foot pain for about 2 weeks      HPI: Patient is 60 y.o. female who presents today for pain in her right foot as well as follow-up of left toenail culture.  Patient states that the right foot pain has been occurring for 3 weeks and it starts towards the right outside of the foot and goes medially.  She denies any trauma or injury to the foot.   Allergies  Allergen Reactions   Penicillins Hives and Rash    Has patient had a PCN reaction causing immediate rash, facial/tongue/throat swelling, SOB or lightheadedness with hypotension: Yes Has patient had a PCN reaction causing severe rash involving mucus membranes or skin necrosis: No Has patient had a PCN reaction that required hospitalization: No Has patient had a PCN reaction occurring within the last 10 years: No If all of the above answers are "NO", then may proceed with Cephalosporin use.    Review of systems is reviewed and negative.   Physical Exam  Patient is awake, alert, and oriented x 3.  In no acute distress.    Vascular status is intact with palpable pedal pulses DP and PT bilateral and capillary refill time less than 3 seconds bilateral.  No edema or erythema noted.   Neurological exam reveals epicritic and protective sensation grossly intact bilateral.   Dermatological exam reveals skin is supple and dry to bilateral feet.  Left hallux nail continues to be thick and discolored.  Culture is positive for onychomycosis.  Musculoskeletal exam: Musculature intact with dorsiflexion, plantarflexion, inversion, eversion.  Pain along the anterior foot near the sinus tarsi region.  Pain radiates from the sinus tarsi and medially.  X-ray evaluation 3 views of the right foot are obtained.  No sign of fracture or dislocation is noted joint spaces are intact.  No sign of arthritic changes noted on x-ray.  Assessment: 1. Right foot pain   2. Encounter for long-term (current)  use of high-risk medication   3. Onychomycosis   4. Capsulitis of foot, right      Plan: Discussed x-ray and exam findings with the patient.  Recommended shoe gear changes and ankle strengthening exercises.  The patient is in a good amount of pain today therefore I recommended an injection into the sinus tarsi region of which was carried out today without complication.  The patient tolerated this well. I also discussed her lab results which did confirm onychomycosis.  Lamisil was called into her pharmacy and she was given instructions on how to take this medication.  Also liver function panel was ordered for her as well.  She will be seen back as needed for follow-up.

## 2021-01-25 NOTE — Patient Instructions (Signed)
Stretching and range-of-motion exercises These exercises warm up your muscles and joints and improve the movement and flexibility of your lower leg and ankle. These exercises also help to relieve pain and stiffness. Standing gastroc stretch This exercise is also called a standing calf (gastroc) stretch. Stand with your hands against a wall. Extend your left / right leg behind you, and bend your front knee slightly. Your heels should be on the floor. Keeping your heels on the floor and your back knee straight, shift your weight toward the wall. You should feel a gentle stretch in the back of your lower leg (calf). Hold this position for __________ seconds. Repeat __________ times. Complete this exercise __________ times a day. Standing soleus stretch This exercise is also called a standing calf (soleus) stretch. Stand with your hands against a wall. Extend your left / right leg behind you, and bend your front knee slightly. Both of your heels should be on the floor. Keeping your heels on the floor, bend your back knee and shift your weight slightly over your back leg. You should feel a gentle stretch deep in your calf. Hold this position for __________ seconds. Repeat __________ times. Complete this exercise __________ times a day. Strengthening exercises These exercises build strength and endurance in your lower leg. Endurance is the ability to use your muscles for a long time, even after they get tired. Heel walking This exercise is sometimes called dorsiflexion. Walk on your heels for __________ seconds or ___________ ft. Keep your toes as high as possible. Repeat __________ times. Complete this exercise __________ times a day. Balance exercises These exercises improve your balance and the reaction and control of your ankle to help improve stability. Multi-angle lunge Stand with your feet together. Take a step forward with your left / right leg, and shift your weight onto that leg. Your  back heel will come off the floor, and your back toes will stay in place. Push off your front leg to return your front foot to the starting position next to your other foot. Repeat to the side, to the back, and any other directions as told by your health care provider. Repeat __________ times. Complete this exercise __________ times a day. Single leg stand If this exercise is too easy, you can try it with your eyes closed or while standing on a pillow. Without shoes, stand near a railing or in a door frame. Hold on to the railing or door frame as needed. Let loose of the railing or door frame as you are able. Stand on your left / right foot. Keep your big toe down on the floor and try to keep your arch lifted. Hold this position for __________ seconds. Repeat __________ times. Complete this exercise __________ times a day. Ankle inversion and eversion This exercise is also called foot rotation with a balance board. This exercise uses a balance board to rotate the foot and ankle inward (inversion) and outward (eversion). Ask your health care provider where you can get a balance board or how you can make one. Stand on a non-carpeted surface near a countertop or wall. Step onto the balance board so your feet are hip width apart. Keep your feet in place and keep your upper body and hips steady. Using only your feet and ankles to move the board, do the following exercises as told by your health care provider: Tip the board side to side as far as you can, alternating between tipping to the left and tipping to  the right. Tip the board so it silently taps the floor. Do not let the board forcefully hit the floor. From time to time, pause to hold a steady midway position, with neither the right nor the left sides touching the ground. Tip the board side to side so the board does not hit the floor at all. From time to time, pause to hold a steady midway position. Repeat __________ times. Complete this  exercise __________ times a day. Ankle plantar flexion and dorsiflexion This exercise is also called foot flexion with a balance board. This exercise uses a balance board to push the foot downward and away from the leg (plantar flexion) or upward and toward the leg (dorsiflexion). Ask your health care provider where you can get a balance board or how you can make one. Stand on a non-carpeted surface near a countertop or wall. Step onto the balance board so your feet are hip width apart. Keep your feet in place and keep your upper body and hips steady. Using only your feet and ankles to move the board, do one or both of the following exercises as told by your health care provider: Tip the board forward and backward so the board silently taps the floor. Do not let the board forcefully hit the floor. From time to time, pause to hold a steady position midway between touching the floor in front and touching the floor in back. Tip the board forward and backward so the board does not hit the floor at all. From time to time, pause to hold a steady position in the middle. Repeat __________ times. Complete this exercise __________ times a day. This information is not intended to replace advice given to you by your health care provider. Make sure you discuss any questions you have with your health care provider. Document Revised: 06/14/2020 Document Reviewed: 06/14/2020 Elsevier Patient Education  2022 [employer].

## 2021-01-28 ENCOUNTER — Other Ambulatory Visit: Payer: Self-pay | Admitting: *Deleted

## 2021-01-28 ENCOUNTER — Other Ambulatory Visit: Payer: Self-pay | Admitting: Podiatrist

## 2021-01-28 DIAGNOSIS — B351 Tinea unguium: Secondary | ICD-10-CM

## 2021-01-28 DIAGNOSIS — M779 Enthesopathy, unspecified: Secondary | ICD-10-CM

## 2021-03-18 DIAGNOSIS — M130 Polyarthritis, unspecified: Secondary | ICD-10-CM | POA: Diagnosis not present

## 2021-03-18 DIAGNOSIS — E782 Mixed hyperlipidemia: Secondary | ICD-10-CM | POA: Diagnosis not present

## 2021-05-27 DIAGNOSIS — H2513 Age-related nuclear cataract, bilateral: Secondary | ICD-10-CM | POA: Diagnosis not present

## 2021-05-27 DIAGNOSIS — H524 Presbyopia: Secondary | ICD-10-CM | POA: Diagnosis not present

## 2021-05-27 DIAGNOSIS — H1789 Other corneal scars and opacities: Secondary | ICD-10-CM | POA: Diagnosis not present

## 2021-05-27 DIAGNOSIS — H5203 Hypermetropia, bilateral: Secondary | ICD-10-CM | POA: Diagnosis not present

## 2021-07-18 DIAGNOSIS — M130 Polyarthritis, unspecified: Secondary | ICD-10-CM | POA: Diagnosis not present

## 2021-07-18 DIAGNOSIS — E782 Mixed hyperlipidemia: Secondary | ICD-10-CM | POA: Diagnosis not present

## 2021-07-18 DIAGNOSIS — Z0131 Encounter for examination of blood pressure with abnormal findings: Secondary | ICD-10-CM | POA: Diagnosis not present

## 2021-07-18 DIAGNOSIS — I83899 Varicose veins of unspecified lower extremities with other complications: Secondary | ICD-10-CM | POA: Diagnosis not present

## 2021-07-18 DIAGNOSIS — R7309 Other abnormal glucose: Secondary | ICD-10-CM | POA: Diagnosis not present

## 2021-07-18 DIAGNOSIS — Z6824 Body mass index (BMI) 24.0-24.9, adult: Secondary | ICD-10-CM | POA: Diagnosis not present

## 2021-08-12 DIAGNOSIS — S0502XA Injury of conjunctiva and corneal abrasion without foreign body, left eye, initial encounter: Secondary | ICD-10-CM | POA: Diagnosis not present

## 2021-08-13 DIAGNOSIS — B0052 Herpesviral keratitis: Secondary | ICD-10-CM | POA: Diagnosis not present

## 2021-08-16 DIAGNOSIS — B0052 Herpesviral keratitis: Secondary | ICD-10-CM | POA: Diagnosis not present

## 2021-08-20 DIAGNOSIS — B0052 Herpesviral keratitis: Secondary | ICD-10-CM | POA: Diagnosis not present

## 2021-08-23 DIAGNOSIS — B0052 Herpesviral keratitis: Secondary | ICD-10-CM | POA: Diagnosis not present

## 2021-09-02 DIAGNOSIS — B0052 Herpesviral keratitis: Secondary | ICD-10-CM | POA: Diagnosis not present

## 2021-09-07 DIAGNOSIS — Z1231 Encounter for screening mammogram for malignant neoplasm of breast: Secondary | ICD-10-CM | POA: Diagnosis not present

## 2021-09-16 DIAGNOSIS — B0052 Herpesviral keratitis: Secondary | ICD-10-CM | POA: Diagnosis not present

## 2021-09-23 DIAGNOSIS — B0052 Herpesviral keratitis: Secondary | ICD-10-CM | POA: Diagnosis not present

## 2021-10-08 DIAGNOSIS — T1511XA Foreign body in conjunctival sac, right eye, initial encounter: Secondary | ICD-10-CM | POA: Diagnosis not present

## 2021-11-07 DIAGNOSIS — B0052 Herpesviral keratitis: Secondary | ICD-10-CM | POA: Diagnosis not present

## 2021-12-11 DIAGNOSIS — F411 Generalized anxiety disorder: Secondary | ICD-10-CM | POA: Diagnosis not present

## 2021-12-11 DIAGNOSIS — M130 Polyarthritis, unspecified: Secondary | ICD-10-CM | POA: Diagnosis not present

## 2021-12-11 DIAGNOSIS — Z6822 Body mass index (BMI) 22.0-22.9, adult: Secondary | ICD-10-CM | POA: Diagnosis not present

## 2021-12-11 DIAGNOSIS — I83899 Varicose veins of unspecified lower extremities with other complications: Secondary | ICD-10-CM | POA: Diagnosis not present

## 2021-12-11 DIAGNOSIS — E782 Mixed hyperlipidemia: Secondary | ICD-10-CM | POA: Diagnosis not present

## 2022-02-11 DIAGNOSIS — H5203 Hypermetropia, bilateral: Secondary | ICD-10-CM | POA: Diagnosis not present

## 2022-02-11 DIAGNOSIS — B0052 Herpesviral keratitis: Secondary | ICD-10-CM | POA: Diagnosis not present

## 2022-03-18 DIAGNOSIS — E782 Mixed hyperlipidemia: Secondary | ICD-10-CM | POA: Diagnosis not present

## 2022-03-18 DIAGNOSIS — F411 Generalized anxiety disorder: Secondary | ICD-10-CM | POA: Diagnosis not present

## 2022-03-18 DIAGNOSIS — M130 Polyarthritis, unspecified: Secondary | ICD-10-CM | POA: Diagnosis not present

## 2022-08-19 DIAGNOSIS — Z6822 Body mass index (BMI) 22.0-22.9, adult: Secondary | ICD-10-CM | POA: Diagnosis not present

## 2022-08-19 DIAGNOSIS — E782 Mixed hyperlipidemia: Secondary | ICD-10-CM | POA: Diagnosis not present

## 2022-08-19 DIAGNOSIS — F411 Generalized anxiety disorder: Secondary | ICD-10-CM | POA: Diagnosis not present

## 2022-08-19 DIAGNOSIS — M130 Polyarthritis, unspecified: Secondary | ICD-10-CM | POA: Diagnosis not present

## 2022-09-10 DIAGNOSIS — Z1231 Encounter for screening mammogram for malignant neoplasm of breast: Secondary | ICD-10-CM | POA: Diagnosis not present

## 2022-09-15 DIAGNOSIS — N951 Menopausal and female climacteric states: Secondary | ICD-10-CM | POA: Diagnosis not present

## 2022-09-15 DIAGNOSIS — Z7952 Long term (current) use of systemic steroids: Secondary | ICD-10-CM | POA: Diagnosis not present

## 2022-09-18 ENCOUNTER — Other Ambulatory Visit: Payer: Self-pay | Admitting: Family Medicine

## 2022-09-18 ENCOUNTER — Other Ambulatory Visit (HOSPITAL_COMMUNITY)
Admission: RE | Admit: 2022-09-18 | Discharge: 2022-09-18 | Disposition: A | Discharge status: Home / Self Care | Payer: Medicare HMO | Source: Ambulatory Visit | Attending: Family Medicine | Admitting: Family Medicine

## 2022-09-18 DIAGNOSIS — Z01419 Encounter for gynecological examination (general) (routine) without abnormal findings: Secondary | ICD-10-CM | POA: Insufficient documentation

## 2022-09-18 DIAGNOSIS — F411 Generalized anxiety disorder: Secondary | ICD-10-CM | POA: Diagnosis not present

## 2022-09-18 DIAGNOSIS — Z124 Encounter for screening for malignant neoplasm of cervix: Secondary | ICD-10-CM | POA: Diagnosis present

## 2022-09-18 DIAGNOSIS — M8580 Other specified disorders of bone density and structure, unspecified site: Secondary | ICD-10-CM | POA: Diagnosis not present

## 2022-09-18 DIAGNOSIS — E782 Mixed hyperlipidemia: Secondary | ICD-10-CM | POA: Diagnosis not present

## 2022-09-18 DIAGNOSIS — Z1151 Encounter for screening for human papillomavirus (HPV): Secondary | ICD-10-CM | POA: Insufficient documentation

## 2022-09-24 LAB — CYTOLOGY - PAP
Comment: NEGATIVE
Diagnosis: NEGATIVE
High risk HPV: NEGATIVE

## 2023-01-06 DIAGNOSIS — H524 Presbyopia: Secondary | ICD-10-CM | POA: Diagnosis not present

## 2023-01-06 DIAGNOSIS — H1789 Other corneal scars and opacities: Secondary | ICD-10-CM | POA: Diagnosis not present

## 2023-01-22 DIAGNOSIS — F4381 Prolonged grief disorder: Secondary | ICD-10-CM | POA: Diagnosis not present

## 2023-01-22 DIAGNOSIS — E782 Mixed hyperlipidemia: Secondary | ICD-10-CM | POA: Diagnosis not present

## 2023-01-22 DIAGNOSIS — F411 Generalized anxiety disorder: Secondary | ICD-10-CM | POA: Diagnosis not present

## 2023-01-22 DIAGNOSIS — R7303 Prediabetes: Secondary | ICD-10-CM | POA: Diagnosis not present

## 2023-01-22 DIAGNOSIS — F410 Panic disorder [episodic paroxysmal anxiety] without agoraphobia: Secondary | ICD-10-CM | POA: Diagnosis not present

## 2023-01-22 DIAGNOSIS — M8580 Other specified disorders of bone density and structure, unspecified site: Secondary | ICD-10-CM | POA: Diagnosis not present

## 2023-01-22 DIAGNOSIS — M130 Polyarthritis, unspecified: Secondary | ICD-10-CM | POA: Diagnosis not present

## 2023-02-12 DIAGNOSIS — E782 Mixed hyperlipidemia: Secondary | ICD-10-CM | POA: Diagnosis not present

## 2023-02-12 DIAGNOSIS — F4381 Prolonged grief disorder: Secondary | ICD-10-CM | POA: Diagnosis not present

## 2023-02-12 DIAGNOSIS — R7303 Prediabetes: Secondary | ICD-10-CM | POA: Diagnosis not present

## 2023-02-12 DIAGNOSIS — F411 Generalized anxiety disorder: Secondary | ICD-10-CM | POA: Diagnosis not present

## 2023-06-17 DIAGNOSIS — M810 Age-related osteoporosis without current pathological fracture: Secondary | ICD-10-CM | POA: Diagnosis not present

## 2023-06-17 DIAGNOSIS — Z0000 Encounter for general adult medical examination without abnormal findings: Secondary | ICD-10-CM | POA: Diagnosis not present

## 2023-06-17 DIAGNOSIS — R7309 Other abnormal glucose: Secondary | ICD-10-CM | POA: Diagnosis not present

## 2023-06-17 DIAGNOSIS — Z6823 Body mass index (BMI) 23.0-23.9, adult: Secondary | ICD-10-CM | POA: Diagnosis not present

## 2023-06-17 DIAGNOSIS — M130 Polyarthritis, unspecified: Secondary | ICD-10-CM | POA: Diagnosis not present

## 2023-06-17 DIAGNOSIS — E782 Mixed hyperlipidemia: Secondary | ICD-10-CM | POA: Diagnosis not present

## 2023-06-17 DIAGNOSIS — R7303 Prediabetes: Secondary | ICD-10-CM | POA: Diagnosis not present

## 2023-09-15 DIAGNOSIS — R7303 Prediabetes: Secondary | ICD-10-CM | POA: Diagnosis not present

## 2023-09-15 DIAGNOSIS — M810 Age-related osteoporosis without current pathological fracture: Secondary | ICD-10-CM | POA: Diagnosis not present

## 2023-09-15 DIAGNOSIS — E782 Mixed hyperlipidemia: Secondary | ICD-10-CM | POA: Diagnosis not present

## 2023-09-16 DIAGNOSIS — Z1231 Encounter for screening mammogram for malignant neoplasm of breast: Secondary | ICD-10-CM | POA: Diagnosis not present

## 2023-11-03 DIAGNOSIS — Z1211 Encounter for screening for malignant neoplasm of colon: Secondary | ICD-10-CM | POA: Diagnosis not present

## 2023-12-08 DIAGNOSIS — Z1211 Encounter for screening for malignant neoplasm of colon: Secondary | ICD-10-CM | POA: Diagnosis not present

## 2023-12-08 DIAGNOSIS — K529 Noninfective gastroenteritis and colitis, unspecified: Secondary | ICD-10-CM | POA: Diagnosis not present

## 2023-12-08 DIAGNOSIS — K635 Polyp of colon: Secondary | ICD-10-CM | POA: Diagnosis not present

## 2023-12-28 DIAGNOSIS — R7303 Prediabetes: Secondary | ICD-10-CM | POA: Diagnosis not present

## 2023-12-28 DIAGNOSIS — E782 Mixed hyperlipidemia: Secondary | ICD-10-CM | POA: Diagnosis not present

## 2023-12-28 DIAGNOSIS — M810 Age-related osteoporosis without current pathological fracture: Secondary | ICD-10-CM | POA: Diagnosis not present

## 2023-12-28 DIAGNOSIS — I10 Essential (primary) hypertension: Secondary | ICD-10-CM | POA: Diagnosis not present

## 2023-12-28 DIAGNOSIS — Z6823 Body mass index (BMI) 23.0-23.9, adult: Secondary | ICD-10-CM | POA: Diagnosis not present

## 2024-01-26 DIAGNOSIS — K648 Other hemorrhoids: Secondary | ICD-10-CM | POA: Diagnosis not present

## 2024-01-26 DIAGNOSIS — Z860100 Personal history of colon polyps, unspecified: Secondary | ICD-10-CM | POA: Diagnosis not present

## 2024-01-26 DIAGNOSIS — K529 Noninfective gastroenteritis and colitis, unspecified: Secondary | ICD-10-CM | POA: Diagnosis not present

## 2024-02-04 DIAGNOSIS — K529 Noninfective gastroenteritis and colitis, unspecified: Secondary | ICD-10-CM | POA: Diagnosis not present

## 2024-03-01 DIAGNOSIS — N182 Chronic kidney disease, stage 2 (mild): Secondary | ICD-10-CM | POA: Diagnosis not present

## 2024-03-01 DIAGNOSIS — F411 Generalized anxiety disorder: Secondary | ICD-10-CM | POA: Diagnosis not present

## 2024-03-01 DIAGNOSIS — M8580 Other specified disorders of bone density and structure, unspecified site: Secondary | ICD-10-CM | POA: Diagnosis not present

## 2024-03-01 DIAGNOSIS — Z87891 Personal history of nicotine dependence: Secondary | ICD-10-CM | POA: Diagnosis not present

## 2024-03-01 DIAGNOSIS — F4381 Prolonged grief disorder: Secondary | ICD-10-CM | POA: Diagnosis not present
